# Patient Record
Sex: Male | Born: 1950
Health system: Southern US, Community
[De-identification: ages and names within clinical notes are randomized; demographics above are authoritative.]

## PROBLEM LIST (undated history)

## (undated) DIAGNOSIS — I10 Essential (primary) hypertension: Secondary | ICD-10-CM

---

## 2001-11-20 ENCOUNTER — Encounter: Admission: RE | Admit: 2001-11-20 | Discharge: 2001-11-20 | Payer: Self-pay | Admitting: Family Medicine

## 2001-11-20 ENCOUNTER — Encounter: Payer: Self-pay | Admitting: Family Medicine

## 2002-01-01 ENCOUNTER — Ambulatory Visit (HOSPITAL_BASED_OUTPATIENT_CLINIC_OR_DEPARTMENT_OTHER): Admission: RE | Admit: 2002-01-01 | Discharge: 2002-01-01 | Payer: Self-pay | Admitting: Plastic Surgery

## 2002-12-04 ENCOUNTER — Ambulatory Visit (HOSPITAL_COMMUNITY): Admission: RE | Admit: 2002-12-04 | Discharge: 2002-12-04 | Payer: Self-pay | Admitting: Gastroenterology

## 2006-03-05 ENCOUNTER — Encounter: Admission: RE | Admit: 2006-03-05 | Discharge: 2006-03-05 | Payer: Self-pay | Admitting: Family Medicine

## 2014-05-31 ENCOUNTER — Other Ambulatory Visit: Payer: Self-pay | Admitting: Family Medicine

## 2014-05-31 ENCOUNTER — Ambulatory Visit
Admission: RE | Admit: 2014-05-31 | Discharge: 2014-05-31 | Disposition: A | Discharge status: Home / Self Care | Payer: PRIVATE HEALTH INSURANCE | Source: Ambulatory Visit | Attending: Family Medicine | Admitting: Family Medicine

## 2014-05-31 DIAGNOSIS — R059 Cough, unspecified: Secondary | ICD-10-CM

## 2014-05-31 DIAGNOSIS — R05 Cough: Secondary | ICD-10-CM

## 2016-08-20 ENCOUNTER — Ambulatory Visit
Admission: RE | Admit: 2016-08-20 | Discharge: 2016-08-20 | Disposition: A | Discharge status: Home / Self Care | Payer: PRIVATE HEALTH INSURANCE | Source: Ambulatory Visit | Attending: Family Medicine | Admitting: Family Medicine

## 2016-08-20 ENCOUNTER — Other Ambulatory Visit: Payer: Self-pay | Admitting: Family Medicine

## 2016-08-20 DIAGNOSIS — R059 Cough, unspecified: Secondary | ICD-10-CM

## 2016-08-20 DIAGNOSIS — R05 Cough: Secondary | ICD-10-CM

## 2016-08-20 DIAGNOSIS — R079 Chest pain, unspecified: Secondary | ICD-10-CM

## 2017-03-05 ENCOUNTER — Other Ambulatory Visit: Payer: Self-pay | Admitting: Family Medicine

## 2017-03-05 DIAGNOSIS — F17200 Nicotine dependence, unspecified, uncomplicated: Secondary | ICD-10-CM | POA: Diagnosis not present

## 2017-03-05 DIAGNOSIS — Z1211 Encounter for screening for malignant neoplasm of colon: Secondary | ICD-10-CM | POA: Diagnosis not present

## 2017-03-05 DIAGNOSIS — Z1159 Encounter for screening for other viral diseases: Secondary | ICD-10-CM | POA: Diagnosis not present

## 2017-03-05 DIAGNOSIS — R0989 Other specified symptoms and signs involving the circulatory and respiratory systems: Secondary | ICD-10-CM | POA: Diagnosis not present

## 2017-03-05 DIAGNOSIS — I129 Hypertensive chronic kidney disease with stage 1 through stage 4 chronic kidney disease, or unspecified chronic kidney disease: Secondary | ICD-10-CM | POA: Diagnosis not present

## 2017-03-05 DIAGNOSIS — R972 Elevated prostate specific antigen [PSA]: Secondary | ICD-10-CM | POA: Diagnosis not present

## 2017-03-05 DIAGNOSIS — E782 Mixed hyperlipidemia: Secondary | ICD-10-CM | POA: Diagnosis not present

## 2017-03-05 DIAGNOSIS — N182 Chronic kidney disease, stage 2 (mild): Secondary | ICD-10-CM | POA: Diagnosis not present

## 2017-03-05 DIAGNOSIS — Z23 Encounter for immunization: Secondary | ICD-10-CM | POA: Diagnosis not present

## 2017-03-05 DIAGNOSIS — R809 Proteinuria, unspecified: Secondary | ICD-10-CM | POA: Diagnosis not present

## 2017-03-05 DIAGNOSIS — Z125 Encounter for screening for malignant neoplasm of prostate: Secondary | ICD-10-CM | POA: Diagnosis not present

## 2017-03-05 DIAGNOSIS — Z0000 Encounter for general adult medical examination without abnormal findings: Secondary | ICD-10-CM | POA: Diagnosis not present

## 2017-03-12 ENCOUNTER — Ambulatory Visit
Admission: RE | Admit: 2017-03-12 | Discharge: 2017-03-12 | Disposition: A | Discharge status: Home / Self Care | Payer: Medicare Other | Source: Ambulatory Visit | Attending: Family Medicine | Admitting: Family Medicine

## 2017-03-12 DIAGNOSIS — Z87891 Personal history of nicotine dependence: Secondary | ICD-10-CM | POA: Diagnosis not present

## 2017-03-12 DIAGNOSIS — Z136 Encounter for screening for cardiovascular disorders: Secondary | ICD-10-CM | POA: Diagnosis not present

## 2017-03-12 DIAGNOSIS — F17200 Nicotine dependence, unspecified, uncomplicated: Secondary | ICD-10-CM

## 2017-05-10 DIAGNOSIS — F17200 Nicotine dependence, unspecified, uncomplicated: Secondary | ICD-10-CM | POA: Diagnosis not present

## 2017-05-10 DIAGNOSIS — D125 Benign neoplasm of sigmoid colon: Secondary | ICD-10-CM | POA: Diagnosis not present

## 2017-05-10 DIAGNOSIS — K635 Polyp of colon: Secondary | ICD-10-CM | POA: Diagnosis not present

## 2017-05-10 DIAGNOSIS — Z1211 Encounter for screening for malignant neoplasm of colon: Secondary | ICD-10-CM | POA: Diagnosis not present

## 2017-05-10 DIAGNOSIS — K648 Other hemorrhoids: Secondary | ICD-10-CM | POA: Diagnosis not present

## 2017-05-10 DIAGNOSIS — K5730 Diverticulosis of large intestine without perforation or abscess without bleeding: Secondary | ICD-10-CM | POA: Diagnosis not present

## 2017-05-10 DIAGNOSIS — D123 Benign neoplasm of transverse colon: Secondary | ICD-10-CM | POA: Diagnosis not present

## 2017-05-10 DIAGNOSIS — Z86010 Personal history of colonic polyps: Secondary | ICD-10-CM | POA: Diagnosis not present

## 2017-05-10 DIAGNOSIS — Z7982 Long term (current) use of aspirin: Secondary | ICD-10-CM | POA: Diagnosis not present

## 2017-05-28 DIAGNOSIS — R197 Diarrhea, unspecified: Secondary | ICD-10-CM | POA: Diagnosis not present

## 2017-05-29 DIAGNOSIS — R197 Diarrhea, unspecified: Secondary | ICD-10-CM | POA: Diagnosis not present

## 2017-10-09 DIAGNOSIS — E871 Hypo-osmolality and hyponatremia: Secondary | ICD-10-CM | POA: Diagnosis not present

## 2017-10-09 DIAGNOSIS — I10 Essential (primary) hypertension: Secondary | ICD-10-CM | POA: Diagnosis not present

## 2017-10-09 DIAGNOSIS — R21 Rash and other nonspecific skin eruption: Secondary | ICD-10-CM | POA: Diagnosis not present

## 2017-10-14 ENCOUNTER — Emergency Department (HOSPITAL_BASED_OUTPATIENT_CLINIC_OR_DEPARTMENT_OTHER): Payer: Medicare Other

## 2017-10-14 ENCOUNTER — Other Ambulatory Visit: Payer: Self-pay

## 2017-10-14 ENCOUNTER — Encounter (HOSPITAL_BASED_OUTPATIENT_CLINIC_OR_DEPARTMENT_OTHER): Payer: Self-pay | Admitting: Emergency Medicine

## 2017-10-14 ENCOUNTER — Emergency Department (HOSPITAL_BASED_OUTPATIENT_CLINIC_OR_DEPARTMENT_OTHER)
Admission: EM | Admit: 2017-10-14 | Discharge: 2017-10-14 | Disposition: A | Discharge status: Home / Self Care | Payer: Medicare Other | Attending: Emergency Medicine | Admitting: Emergency Medicine

## 2017-10-14 DIAGNOSIS — L509 Urticaria, unspecified: Secondary | ICD-10-CM | POA: Insufficient documentation

## 2017-10-14 DIAGNOSIS — I10 Essential (primary) hypertension: Secondary | ICD-10-CM | POA: Insufficient documentation

## 2017-10-14 DIAGNOSIS — F17210 Nicotine dependence, cigarettes, uncomplicated: Secondary | ICD-10-CM | POA: Insufficient documentation

## 2017-10-14 DIAGNOSIS — T7840XA Allergy, unspecified, initial encounter: Secondary | ICD-10-CM | POA: Insufficient documentation

## 2017-10-14 DIAGNOSIS — R220 Localized swelling, mass and lump, head: Secondary | ICD-10-CM

## 2017-10-14 DIAGNOSIS — R21 Rash and other nonspecific skin eruption: Secondary | ICD-10-CM | POA: Diagnosis not present

## 2017-10-14 DIAGNOSIS — R600 Localized edema: Secondary | ICD-10-CM | POA: Diagnosis present

## 2017-10-14 HISTORY — DX: Essential (primary) hypertension: I10

## 2017-10-14 LAB — CBC WITH DIFFERENTIAL/PLATELET
BASOS ABS: 0 10*3/uL (ref 0.0–0.1)
BASOS PCT: 1 %
EOS ABS: 0.3 10*3/uL (ref 0.0–0.7)
Eosinophils Relative: 4 %
HEMATOCRIT: 36.9 % — AB (ref 39.0–52.0)
HEMOGLOBIN: 12.9 g/dL — AB (ref 13.0–17.0)
Lymphocytes Relative: 17 %
Lymphs Abs: 1.4 10*3/uL (ref 0.7–4.0)
MCH: 32.7 pg (ref 26.0–34.0)
MCHC: 35 g/dL (ref 30.0–36.0)
MCV: 93.7 fL (ref 78.0–100.0)
Monocytes Absolute: 0.8 10*3/uL (ref 0.1–1.0)
Monocytes Relative: 10 %
NEUTROS ABS: 5.8 10*3/uL (ref 1.7–7.7)
NEUTROS PCT: 68 %
Platelets: 266 10*3/uL (ref 150–400)
RBC: 3.94 MIL/uL — ABNORMAL LOW (ref 4.22–5.81)
RDW: 12.1 % (ref 11.5–15.5)
WBC: 8.4 10*3/uL (ref 4.0–10.5)

## 2017-10-14 LAB — BASIC METABOLIC PANEL
ANION GAP: 9 (ref 5–15)
BUN: 9 mg/dL (ref 6–20)
CALCIUM: 8.9 mg/dL (ref 8.9–10.3)
CO2: 28 mmol/L (ref 22–32)
CREATININE: 0.78 mg/dL (ref 0.61–1.24)
Chloride: 96 mmol/L — ABNORMAL LOW (ref 101–111)
GFR calc non Af Amer: >60 mL/min (ref >60)
Glucose, Bld: 117 mg/dL — ABNORMAL HIGH (ref 65–99)
Potassium: 4.7 mmol/L (ref 3.5–5.1)
SODIUM: 133 mmol/L — AB (ref 135–145)

## 2017-10-14 MED ORDER — FAMOTIDINE IN NACL 20-0.9 MG/50ML-% IV SOLN
20.0000 mg | Freq: Once | INTRAVENOUS | Status: AC
  Administered 2017-10-14: 20 mg via INTRAVENOUS
  Filled 2017-10-14: qty 50

## 2017-10-14 MED ORDER — METHYLPREDNISOLONE SODIUM SUCC 125 MG IJ SOLR
125.0000 mg | Freq: Once | INTRAMUSCULAR | Status: AC
  Administered 2017-10-14: 125 mg via INTRAVENOUS
  Filled 2017-10-14: qty 2

## 2017-10-14 MED ORDER — SODIUM CHLORIDE 0.9 % IV BOLUS (SEPSIS)
500.0000 mL | Freq: Once | INTRAVENOUS | Status: AC
  Administered 2017-10-14: 500 mL via INTRAVENOUS

## 2017-10-14 MED ORDER — FAMOTIDINE 40 MG PO TABS: 40.0000 mg | ORAL_TABLET | Freq: Every day | ORAL | 0 refills | Status: DC

## 2017-10-14 MED ORDER — EPINEPHRINE 0.3 MG/0.3ML IJ SOAJ: 0.3000 mg | Freq: Once | INTRAMUSCULAR | 0 refills | Status: AC

## 2017-10-14 MED ORDER — DIPHENHYDRAMINE HCL 50 MG/ML IJ SOLN
25.0000 mg | Freq: Once | INTRAMUSCULAR | Status: AC
  Administered 2017-10-14: 25 mg via INTRAVENOUS
  Filled 2017-10-14: qty 1

## 2017-10-14 MED ORDER — PREDNISONE 20 MG PO TABS: 40.0000 mg | ORAL_TABLET | Freq: Every day | ORAL | 0 refills | Status: DC

## 2017-10-14 MED FILL — EPINEPHRINE 0.3 MG AUTO-INJ: 0.3 | 30 days supply | Qty: 2 | Fill #0

## 2017-10-14 MED FILL — FAMOTIDINE 40 MG TABLET: 40 | 30 days supply | Qty: 30 | Fill #0

## 2017-10-14 MED FILL — predniSONE 20 MG TABS: 20 | 2 days supply | Qty: 4 | Fill #0

## 2017-10-14 NOTE — ED Triage Notes (Signed)
Reports diagnosed with viral infection last week.  States over the past week he has begun to have a rash to his back side.  Reports that he woke up this morning with lip swelling.  Treated with 1 benadryl at home prior to arrival.

## 2017-10-14 NOTE — ED Notes (Signed)
ED Provider at bedside. 

## 2017-10-14 NOTE — ED Notes (Signed)
Pt verbalized understanding of discharge instructions and denies any further questions at this time.   

## 2017-10-14 NOTE — Discharge Instructions (Signed)

## 2017-10-14 NOTE — ED Provider Notes (Signed)
Emergency Department Provider Note   I have reviewed the triage vital signs and the nursing notes.   HISTORY  Chief Complaint Facial Swelling   HPI Sean Austin is a 67 y.o. male with PMH of HTN and HLD presents to the ED for evaluation of swelling to the upper lip, voice change, and migratory/itchy rash.  The patient developed a rash yesterday which was slightly itchy.  He notes a recent upper respiratory tract infection some diarrhea which have resolved.  He is been compliant with his home medications.  He does not take any ACE/ARB meds.  No history of lip/tongue swelling in the past.  This morning he woke up and had mild swelling of the upper lip and noticed that it worsened throughout the morning.  He also developed a slight change to the voice and so presented to the emergency department.  He denies any sensation of throat tightness or swelling.  No tongue swelling.  No difficulty swallowing or breathing.  He did take 25 mg of Benadryl prior to ED presentation.    Past Medical History:  Diagnosis Date  . Hypertension     There are no active problems to display for this patient.   History reviewed. No pertinent surgical history.  Current Outpatient Rx  . Order #: [id number] Class: Historical Med  . Order #: [id number] Class: Historical Med  . Order #: [id number] Class: Historical Med  . Order #: [id number] Class: Historical Med  . Order #: [id number] Class: Print  . Order #: [id number] Class: Print  . Order #: [id number] Class: Print    Allergies Patient has no known allergies.  History reviewed. No pertinent family history.  Social History Social History   Tobacco Use  . Smoking status: Current Every Day Smoker    Packs/day: 1.00    Types: Cigarettes  . Smokeless tobacco: Never Used  Substance Use Topics  . Alcohol use: Yes  . Drug use: No    Review of Systems  Constitutional: No fever/chills Eyes: No visual changes. ENT: No sore throat. Voice horseness. Positive  upper lip swelling.  Cardiovascular: Denies chest pain. Respiratory: Denies shortness of breath. Gastrointestinal: No abdominal pain.  No nausea, no vomiting.  No diarrhea.  No constipation. Genitourinary: Negative for dysuria. Musculoskeletal: Negative for back pain. Skin: Positive itchy rash.  Neurological: Negative for headaches, focal weakness or numbness.  10-point ROS otherwise negative.  ____________________________________________   PHYSICAL EXAM:  VITAL SIGNS: ED Triage Vitals  Enc Vitals Group     BP 10/14/17 1101 (!) 185/95     Pulse Rate 10/14/17 1101 95     Resp 10/14/17 1101 16     Temp 10/14/17 1101 97.7 F (36.5 C)     Temp Source 10/14/17 1101 Oral     SpO2 10/14/17 1101 100 %     Weight 10/14/17 1101 198 lb (89.8 kg)     Height 10/14/17 1101 6\' 1"  (1.854 m)     Pain Score 10/14/17 1059 2   Constitutional: Alert and oriented. Well appearing and in no acute distress. Eyes: Conjunctivae are normal.  Head: Atraumatic. Nose: No congestion/rhinnorhea. Mouth/Throat: Mucous membranes are moist.  Oropharynx is widely patent. Normal tongue exam. No submandibular swelling. Managing oral secretions. Voice is slightly hoarse.  Neck: No stridor.    Cardiovascular: Normal rate, regular rhythm. Good peripheral circulation. Grossly normal heart sounds.   Respiratory: Normal respiratory effort.  No retractions. Lungs CTAB. Gastrointestinal: Soft and nontender. No distention.  Musculoskeletal: No lower extremity tenderness nor edema. No gross deformities  of extremities. Neurologic:  Normal speech and language. No gross focal neurologic deficits are appreciated.  Skin:  Skin is warm, dry and intact. No rash noted.  ____________________________________________   LABS (all labs ordered are listed, but only abnormal results are displayed)  Labs Reviewed  BASIC METABOLIC PANEL - Abnormal; Notable for the following components:      Result Value   Sodium 133 (*)     Chloride 96 (*)    Glucose, Bld 117 (*)    All other components within normal limits  CBC WITH DIFFERENTIAL/PLATELET - Abnormal; Notable for the following components:   RBC 3.94 (*)    Hemoglobin 12.9 (*)    HCT 36.9 (*)    All other components within normal limits   ____________________________________________  RADIOLOGY  Dg Neck Soft Tissue  Result Date: 10/14/2017 CLINICAL DATA:  Diagnosed with viral infection last week. Rash this week. This morning with lip swelling and loss of voice. EXAM: NECK SOFT TISSUES - 1+ VIEW COMPARISON:  None. FINDINGS: There is no evidence of retropharyngeal soft tissue swelling or epiglottic enlargement. The cervical airway is unremarkable and no radio-opaque foreign body identified. Incidental note made of mild degenerative change in the mid and lower cervical spine. IMPRESSION: No acute findings. Electronically Signed   By: Franki Cabot M.D.   On: 10/14/2017 11:33    ____________________________________________   PROCEDURES  Procedure(s) performed:   Procedures  None ____________________________________________   INITIAL IMPRESSION / ASSESSMENT AND PLAN / ED COURSE  Pertinent labs & imaging results that were available during my care of the patient were reviewed by me and considered in my medical decision making (see chart for details).  Presents to the emergency department for evaluation of itchy rash, upper lip swelling, hoarse voice.  Symptoms began mildly yesterday and worsened through the morning today.  No respiratory distress.  He is managing oral secretions without difficulty.  He has isolated upper lip swelling that seems consistent with angioedema however he also has a hive-like rash which raises my suspicion for acute allergic reaction.  No EpiPen required at this time.  Will start IV fluids, obtain labs, give Benadryl, Pepcid, steroid, and watch closely for progressing symptoms.   12:45 PM Patient is feeling better after  medications.  His lip swelling is noticeably decreased.  He has never had difficulty breathing or swallowing.  Plain film of the neck was reviewed.  He has no acute findings.  Plan for additional observation and reassessment. Labs reviewed with no acute findings.   Patient continues to look well. Discharged home with Rx for EpiPen, Prednisone burst, and Pepcid. Patient to take Benadryl PRN. Discussed use of EpiPen in detail. Patient to discuss Allergist f/u with PCP.   At this time, I do not feel there is any life-threatening condition present. I have reviewed and discussed all results (EKG, imaging, lab, urine as appropriate), exam findings with patient. I have reviewed nursing notes and appropriate previous records.  I feel the patient is safe to be discharged home without further emergent workup. Discussed usual and customary return precautions. Patient and family (if present) verbalize understanding and are comfortable with this plan.  Patient will follow-up with their primary care provider. If they do not have a primary care provider, information for follow-up has been provided to them. All questions have been answered.  ____________________________________________  FINAL CLINICAL IMPRESSION(S) / ED DIAGNOSES  Final diagnoses:  Allergic reaction, initial encounter  Swelling of upper lip  Hives  MEDICATIONS GIVEN DURING THIS VISIT:  Medications  sodium chloride 0.9 % bolus 500 mL (0 mLs Intravenous Stopped 10/14/17 1255)  diphenhydrAMINE (BENADRYL) injection 25 mg (25 mg Intravenous Given 10/14/17 1147)  methylPREDNISolone sodium succinate (SOLU-MEDROL) 125 mg/2 mL injection 125 mg (125 mg Intravenous Given 10/14/17 1147)  famotidine (PEPCID) IVPB 20 mg premix (0 mg Intravenous Stopped 10/14/17 1217)     NEW OUTPATIENT MEDICATIONS STARTED DURING THIS VISIT:  Discharge Medication List as of 10/14/2017  3:10 PM    START taking these medications   Details  EPINEPHrine (EPIPEN 2-PAK) 0.3  mg/0.3 mL IJ SOAJ injection Inject 0.3 mLs (0.3 mg total) into the muscle once for 1 dose., Starting Mon 10/14/2017, Print    famotidine (PEPCID) 40 MG tablet Take 1 tablet (40 mg total) by mouth daily., Starting Mon 10/14/2017, Print    predniSONE (DELTASONE) 20 MG tablet Take 2 tablets (40 mg total) by mouth daily., Starting Mon 10/14/2017, Print        Note:  This document was prepared using Dragon voice recognition software and may include unintentional dictation errors.  Nanda Quinton, MD Emergency Medicine    Bridgitt Raggio, [facility], MD 10/14/17 [phone]

## 2017-10-18 DIAGNOSIS — L509 Urticaria, unspecified: Secondary | ICD-10-CM | POA: Diagnosis not present

## 2017-10-18 DIAGNOSIS — R220 Localized swelling, mass and lump, head: Secondary | ICD-10-CM | POA: Diagnosis not present

## 2018-01-26 ENCOUNTER — Other Ambulatory Visit: Payer: Self-pay

## 2018-01-26 ENCOUNTER — Encounter (HOSPITAL_BASED_OUTPATIENT_CLINIC_OR_DEPARTMENT_OTHER): Payer: Self-pay | Admitting: Emergency Medicine

## 2018-01-26 ENCOUNTER — Emergency Department (HOSPITAL_BASED_OUTPATIENT_CLINIC_OR_DEPARTMENT_OTHER)
Admission: EM | Admit: 2018-01-26 | Discharge: 2018-01-26 | Disposition: A | Discharge status: Home / Self Care | Payer: Medicare Other | Attending: Emergency Medicine | Admitting: Emergency Medicine

## 2018-01-26 DIAGNOSIS — F17210 Nicotine dependence, cigarettes, uncomplicated: Secondary | ICD-10-CM | POA: Diagnosis not present

## 2018-01-26 DIAGNOSIS — Z79899 Other long term (current) drug therapy: Secondary | ICD-10-CM | POA: Diagnosis not present

## 2018-01-26 DIAGNOSIS — R21 Rash and other nonspecific skin eruption: Secondary | ICD-10-CM | POA: Diagnosis not present

## 2018-01-26 DIAGNOSIS — I10 Essential (primary) hypertension: Secondary | ICD-10-CM | POA: Diagnosis not present

## 2018-01-26 MED ORDER — PREDNISONE 50 MG PO TABS: 50.0000 mg | ORAL_TABLET | Freq: Every day | ORAL | 0 refills | Status: DC

## 2018-01-26 MED ORDER — PREDNISONE 50 MG PO TABS
60.0000 mg | ORAL_TABLET | Freq: Once | ORAL | Status: AC
  Administered 2018-01-26: 60 mg via ORAL
  Filled 2018-01-26: qty 1

## 2018-01-26 MED ORDER — FAMOTIDINE 40 MG PO TABS: 40.0000 mg | ORAL_TABLET | Freq: Every day | ORAL | 0 refills | Status: DC

## 2018-01-26 NOTE — ED Triage Notes (Signed)
Pt reports red splotchy rash x 3 days. Possibly allergic to something. Denies itching.

## 2018-01-26 NOTE — Discharge Instructions (Signed)
You were seen in the emergency department today for a rash.  Given that you had a similar rash with previous allergic reaction we are giving you 2 medications today: - Prednisone- this is a steroid that you will take daily for the next 4 days, we gave you a dose of this in the ER today so start tomorrow - Pepcid- take this daily as well as this will help with the reaction.   Continue to take benadryl per over the counter dosing  The medicines that we are giving you today are the same medicines you were prescribed during her last ER visit for a rash that was similar.  As discussed if you start to experience facial swelling, trouble breathing, feeling of throat closing, or any other concerning symptoms return to the emergency department immediately.  As discussed if you feel your epi pen is necessary and you utilize it return to the ER immediately.   We are unsure of exactly what is causing these rashes.  We would like you to follow-up with an allergy/immunologist specialist, we have given you information for when your discharge instructions.  If unable to get into see them in the near future please follow-up with your primary care provider within 3 days and subsequently the specialist when able.

## 2018-01-26 NOTE — ED Provider Notes (Signed)
[facility] EMERGENCY DEPARTMENT Provider Note   CSN: [id number] Arrival date & time: 01/26/18  1312     History   Chief Complaint Chief Complaint  Patient presents with  . Rash    HPI Sean Austin is a 67 y.o. male with a hx of tobacco abuse, HTN, and hyperlipidemia who presents to the ED with complaints of rash that started 3 days ago. Patient states he has a red rash to the upper and lower extremities as well as the chest, abdomen, and back. He states it is not necessarily itchy. Has tried benadryl without much change. No specific alleviating/aggravating factors. He does not believe that he had any recent new exposures such as new foods, products, topical agents, or environments. Denies new medications. He states that he had a very similar rash in March of this year- he states he saw his PCP who thought this was viral, the rash subsequently progressively worsened and he developed dyspnea and facial swelling days later. He states that he has absolutely not had any type of facial swelling/rash, dyspnea, or sensation of throat closing presently, but he states that this rash is similar. He does have an epi pen at home that he has not utilized. Denies fever, nausea, vomiting, diarrhea, congestion, cough, dyspnea, wheezing, or throat closing sensation. NO recent tic exposures.   HPI  Past Medical History:  Diagnosis Date  . Hypertension     There are no active problems to display for this patient.   History reviewed. No pertinent surgical history.      Home Medications    Prior to Admission medications   Medication Sig Start Date End Date Taking? Authorizing Provider  AMLODIPINE BESYLATE PO Take 10 mg by mouth.    [provider]  atenolol (TENORMIN) 50 MG tablet Take 50 mg by mouth daily.    [provider]  famotidine (PEPCID) 40 MG tablet Take 1 tablet (40 mg total) by mouth daily. 10/14/17   Long, [facility], MD  losartan (COZAAR) 50 MG tablet Take  50 mg by mouth daily.    [provider]  predniSONE (DELTASONE) 20 MG tablet Take 2 tablets (40 mg total) by mouth daily. 10/14/17   Long, [facility], MD  simvastatin (ZOCOR) 40 MG tablet Take 40 mg by mouth daily.    [provider]    Family History No family history on file.  Social History Social History   Tobacco Use  . Smoking status: Current Every Day Smoker    Packs/day: 1.00    Types: Cigarettes  . Smokeless tobacco: Never Used  Substance Use Topics  . Alcohol use: Yes  . Drug use: No     Allergies   Patient has no known allergies.   Review of Systems Review of Systems  Constitutional: Negative for chills and fever.  HENT: Negative for congestion and facial swelling.   Respiratory: Negative for cough, shortness of breath and wheezing.   Cardiovascular: Negative for chest pain and palpitations.  Gastrointestinal: Negative for abdominal pain and vomiting.  Skin: Positive for rash.   Physical Exam Updated Vital Signs BP (!) 164/94 (BP Location: Left Arm)   Pulse (!) 111   Temp 99.5 F (37.5 C) (Oral)   Resp 18   Ht 6\' 1"  (1.854 m)   Wt 88.9 kg (196 lb)   SpO2 98%   BMI 25.86 kg/m   Physical Exam  Constitutional: He appears well-developed and well-nourished.  Non-toxic appearance. No distress.  HENT:  Head: Normocephalic and atraumatic.  Right Ear: Tympanic membrane normal.  Left Ear: Tympanic membrane normal.  Nose: Nose normal.  Mouth/Throat: Uvula is midline and oropharynx is clear and moist. No oral lesions. No oropharyngeal exudate or posterior oropharyngeal erythema.  No posterior oropharyngeal edema.  Patient airway is patent.  No trismus.  No drooling.  Tolerating his own secretions without difficulty.  No stridor.  No appreciable facial swelling or angioedema.  Eyes: Pupils are equal, round, and reactive to light. Conjunctivae are normal. Right eye exhibits no discharge. Left eye exhibits no discharge.  Cardiovascular: Normal rate  and regular rhythm.  No murmur heard. Pulmonary/Chest: Effort normal and breath sounds normal. No accessory muscle usage or stridor. No respiratory distress. He has no wheezes. He has no rhonchi. He has no rales.  Abdominal: Soft. There is no tenderness. There is no rigidity, no rebound and no guarding.  Neurological: He is alert.  Clear speech.   Skin: Rash (erythematous somewhat speckled appearing rash to trunk and extremities x 4, blanches, not particularly raised. no mucous membrane involvement. no palm/sole involvement.  no sloughing) noted. No petechiae and no purpura noted. Rash is not nodular, not pustular and not vesicular.  Psychiatric: He has a normal mood and affect. His behavior is normal. Thought content normal.  Nursing note and vitals reviewed.    ED Treatments / Results  Labs (all labs ordered are listed, but only abnormal results are displayed) Labs Reviewed - No data to display  EKG None  Radiology No results found.  Procedures Procedures (including critical care time)  Medications Ordered in ED Medications  predniSONE (DELTASONE) tablet 60 mg (has no administration in time range)     Initial Impression / Assessment and Plan / ED Course  I have reviewed the triage vital signs and the nursing notes.  Pertinent labs & imaging results that were available during my care of the patient were reviewed by me and considered in my medical decision making (see chart for details).   Patient presents with erythematous blanching rash to trunk and extremities. Patient non toxic appearing, in no apparent distress, vitals WNL other than elevated BP and tachycardia which improved throughout visit, doubt HTN emergency, patient aware of need for recheck.  Patient denies any difficulty breathing or swallowing.  Patient has a patent airway without stridor and is handling secretions without difficulty; no angioedema. No blisters, no pustules, no draining sinus tracts, no superficial  abscesses, no bullous impetigo, no vesicles, no desquamation, no target lesions with dusky purpura or a central bulla. Not tender to touch. No concern for superimposed infection. No concern for SJS, TEN, TSS, tick borne illness, syphilis or other life-threatening condition at this time. Rash somewhat suspicion for an allergic dermatitis, given hx of similar. Patient had similar with progression to facial swelling which he received epinephrine for, not having those signs/sxs at this time. Given this information will place patient on steroid burst with prescription for pepcid and continued OTC benadryl with very strict return precautions, he does have an epi pen at home if necessary, discussed reasons to utilize and that if he utilizes it he needs to come to the ER right away. I discussed treatment plan, need for allergy/immunology and PCP follow-up, and return precautions with the patient and his wife. Provided opportunity for questions, patient and his wife confirmed understanding and are in agreement with plan.   Vitals:   01/26/18 1316 01/26/18 1453  BP: (!) 164/94 (!) 157/94  Pulse: (!) 111 98  Resp: 18 16  Temp: 99.5 F (37.5 C)   SpO2: 98% 97%   Final Clinical Impressions(s) / ED Diagnoses   Final diagnoses:  Rash    ED Discharge Orders        Ordered    predniSONE (DELTASONE) 50 MG tablet  Daily with breakfast     01/26/18 1457    famotidine (PEPCID) 40 MG tablet  Daily     01/26/18 1457       Latavius Capizzi, Glynda Jaeger, PA-C 01/26/18 1513    Tanna Furry, MD 02/01/18 1846

## 2018-03-21 ENCOUNTER — Other Ambulatory Visit: Payer: Self-pay | Admitting: Family Medicine

## 2018-03-21 DIAGNOSIS — I714 Abdominal aortic aneurysm, without rupture, unspecified: Secondary | ICD-10-CM

## 2018-03-28 ENCOUNTER — Ambulatory Visit
Admission: RE | Admit: 2018-03-28 | Discharge: 2018-03-28 | Disposition: A | Discharge status: Home / Self Care | Payer: Medicare Other | Source: Ambulatory Visit | Attending: Family Medicine | Admitting: Family Medicine

## 2018-03-28 DIAGNOSIS — I714 Abdominal aortic aneurysm, without rupture, unspecified: Secondary | ICD-10-CM

## 2018-04-02 DIAGNOSIS — R609 Edema, unspecified: Secondary | ICD-10-CM | POA: Diagnosis not present

## 2018-04-02 DIAGNOSIS — F17200 Nicotine dependence, unspecified, uncomplicated: Secondary | ICD-10-CM | POA: Diagnosis not present

## 2018-04-02 DIAGNOSIS — E782 Mixed hyperlipidemia: Secondary | ICD-10-CM | POA: Diagnosis not present

## 2018-04-02 DIAGNOSIS — K635 Polyp of colon: Secondary | ICD-10-CM | POA: Diagnosis not present

## 2018-04-02 DIAGNOSIS — I714 Abdominal aortic aneurysm, without rupture: Secondary | ICD-10-CM | POA: Diagnosis not present

## 2018-04-02 DIAGNOSIS — N182 Chronic kidney disease, stage 2 (mild): Secondary | ICD-10-CM | POA: Diagnosis not present

## 2018-04-02 DIAGNOSIS — I8310 Varicose veins of unspecified lower extremity with inflammation: Secondary | ICD-10-CM | POA: Diagnosis not present

## 2018-04-02 DIAGNOSIS — I129 Hypertensive chronic kidney disease with stage 1 through stage 4 chronic kidney disease, or unspecified chronic kidney disease: Secondary | ICD-10-CM | POA: Diagnosis not present

## 2018-04-02 DIAGNOSIS — R972 Elevated prostate specific antigen [PSA]: Secondary | ICD-10-CM | POA: Diagnosis not present

## 2018-04-02 DIAGNOSIS — Z125 Encounter for screening for malignant neoplasm of prostate: Secondary | ICD-10-CM | POA: Diagnosis not present

## 2018-04-02 DIAGNOSIS — Z1211 Encounter for screening for malignant neoplasm of colon: Secondary | ICD-10-CM | POA: Diagnosis not present

## 2018-04-02 DIAGNOSIS — Z0000 Encounter for general adult medical examination without abnormal findings: Secondary | ICD-10-CM | POA: Diagnosis not present

## 2018-04-03 DIAGNOSIS — Z1211 Encounter for screening for malignant neoplasm of colon: Secondary | ICD-10-CM | POA: Diagnosis not present

## 2018-04-04 DIAGNOSIS — I129 Hypertensive chronic kidney disease with stage 1 through stage 4 chronic kidney disease, or unspecified chronic kidney disease: Secondary | ICD-10-CM | POA: Diagnosis not present

## 2018-04-04 DIAGNOSIS — R972 Elevated prostate specific antigen [PSA]: Secondary | ICD-10-CM | POA: Diagnosis not present

## 2018-04-04 DIAGNOSIS — E782 Mixed hyperlipidemia: Secondary | ICD-10-CM | POA: Diagnosis not present

## 2018-04-04 DIAGNOSIS — Z125 Encounter for screening for malignant neoplasm of prostate: Secondary | ICD-10-CM | POA: Diagnosis not present

## 2018-05-14 DIAGNOSIS — R350 Frequency of micturition: Secondary | ICD-10-CM | POA: Diagnosis not present

## 2018-05-14 DIAGNOSIS — N401 Enlarged prostate with lower urinary tract symptoms: Secondary | ICD-10-CM | POA: Diagnosis not present

## 2018-05-14 DIAGNOSIS — R972 Elevated prostate specific antigen [PSA]: Secondary | ICD-10-CM | POA: Diagnosis not present

## 2018-09-02 DIAGNOSIS — R972 Elevated prostate specific antigen [PSA]: Secondary | ICD-10-CM | POA: Diagnosis not present

## 2018-10-09 DIAGNOSIS — N182 Chronic kidney disease, stage 2 (mild): Secondary | ICD-10-CM | POA: Diagnosis not present

## 2018-10-09 DIAGNOSIS — R972 Elevated prostate specific antigen [PSA]: Secondary | ICD-10-CM | POA: Diagnosis not present

## 2018-10-09 DIAGNOSIS — F17200 Nicotine dependence, unspecified, uncomplicated: Secondary | ICD-10-CM | POA: Diagnosis not present

## 2018-10-09 DIAGNOSIS — I129 Hypertensive chronic kidney disease with stage 1 through stage 4 chronic kidney disease, or unspecified chronic kidney disease: Secondary | ICD-10-CM | POA: Diagnosis not present

## 2018-10-09 DIAGNOSIS — E782 Mixed hyperlipidemia: Secondary | ICD-10-CM | POA: Diagnosis not present

## 2018-11-19 DIAGNOSIS — R972 Elevated prostate specific antigen [PSA]: Secondary | ICD-10-CM | POA: Diagnosis not present

## 2018-11-26 DIAGNOSIS — R351 Nocturia: Secondary | ICD-10-CM | POA: Diagnosis not present

## 2018-11-26 DIAGNOSIS — R972 Elevated prostate specific antigen [PSA]: Secondary | ICD-10-CM | POA: Diagnosis not present

## 2019-04-17 DIAGNOSIS — I129 Hypertensive chronic kidney disease with stage 1 through stage 4 chronic kidney disease, or unspecified chronic kidney disease: Secondary | ICD-10-CM | POA: Diagnosis not present

## 2019-04-17 DIAGNOSIS — E782 Mixed hyperlipidemia: Secondary | ICD-10-CM | POA: Diagnosis not present

## 2019-04-17 DIAGNOSIS — Z125 Encounter for screening for malignant neoplasm of prostate: Secondary | ICD-10-CM | POA: Diagnosis not present

## 2019-04-22 DIAGNOSIS — Z1211 Encounter for screening for malignant neoplasm of colon: Secondary | ICD-10-CM | POA: Diagnosis not present

## 2019-04-22 DIAGNOSIS — I129 Hypertensive chronic kidney disease with stage 1 through stage 4 chronic kidney disease, or unspecified chronic kidney disease: Secondary | ICD-10-CM | POA: Diagnosis not present

## 2019-04-22 DIAGNOSIS — Z0000 Encounter for general adult medical examination without abnormal findings: Secondary | ICD-10-CM | POA: Diagnosis not present

## 2019-04-22 DIAGNOSIS — E782 Mixed hyperlipidemia: Secondary | ICD-10-CM | POA: Diagnosis not present

## 2019-04-22 DIAGNOSIS — Z125 Encounter for screening for malignant neoplasm of prostate: Secondary | ICD-10-CM | POA: Diagnosis not present

## 2019-04-22 DIAGNOSIS — M5430 Sciatica, unspecified side: Secondary | ICD-10-CM | POA: Diagnosis not present

## 2019-04-22 DIAGNOSIS — R972 Elevated prostate specific antigen [PSA]: Secondary | ICD-10-CM | POA: Diagnosis not present

## 2019-04-22 DIAGNOSIS — F17200 Nicotine dependence, unspecified, uncomplicated: Secondary | ICD-10-CM | POA: Diagnosis not present

## 2019-04-22 DIAGNOSIS — I8310 Varicose veins of unspecified lower extremity with inflammation: Secondary | ICD-10-CM | POA: Diagnosis not present

## 2019-04-22 DIAGNOSIS — K635 Polyp of colon: Secondary | ICD-10-CM | POA: Diagnosis not present

## 2019-04-22 DIAGNOSIS — N182 Chronic kidney disease, stage 2 (mild): Secondary | ICD-10-CM | POA: Diagnosis not present

## 2019-04-22 DIAGNOSIS — I714 Abdominal aortic aneurysm, without rupture: Secondary | ICD-10-CM | POA: Diagnosis not present

## 2019-05-06 DIAGNOSIS — K635 Polyp of colon: Secondary | ICD-10-CM | POA: Diagnosis not present

## 2019-05-13 DIAGNOSIS — R351 Nocturia: Secondary | ICD-10-CM | POA: Diagnosis not present

## 2019-05-13 DIAGNOSIS — N401 Enlarged prostate with lower urinary tract symptoms: Secondary | ICD-10-CM | POA: Diagnosis not present

## 2019-05-13 DIAGNOSIS — R972 Elevated prostate specific antigen [PSA]: Secondary | ICD-10-CM | POA: Diagnosis not present

## 2019-05-15 DIAGNOSIS — Z7189 Other specified counseling: Secondary | ICD-10-CM | POA: Diagnosis not present

## 2019-05-15 DIAGNOSIS — R21 Rash and other nonspecific skin eruption: Secondary | ICD-10-CM | POA: Diagnosis not present

## 2019-05-15 DIAGNOSIS — R1110 Vomiting, unspecified: Secondary | ICD-10-CM | POA: Diagnosis not present

## 2019-05-15 DIAGNOSIS — R5383 Other fatigue: Secondary | ICD-10-CM | POA: Diagnosis not present

## 2019-08-10 DIAGNOSIS — R972 Elevated prostate specific antigen [PSA]: Secondary | ICD-10-CM | POA: Diagnosis not present

## 2019-09-22 ENCOUNTER — Ambulatory Visit: Payer: Medicare Other | Attending: Internal Medicine

## 2019-09-24 ENCOUNTER — Ambulatory Visit: Payer: Medicare Other

## 2019-10-26 DIAGNOSIS — N182 Chronic kidney disease, stage 2 (mild): Secondary | ICD-10-CM | POA: Diagnosis not present

## 2019-10-26 DIAGNOSIS — E782 Mixed hyperlipidemia: Secondary | ICD-10-CM | POA: Diagnosis not present

## 2019-10-26 DIAGNOSIS — F17200 Nicotine dependence, unspecified, uncomplicated: Secondary | ICD-10-CM | POA: Diagnosis not present

## 2019-10-26 DIAGNOSIS — I129 Hypertensive chronic kidney disease with stage 1 through stage 4 chronic kidney disease, or unspecified chronic kidney disease: Secondary | ICD-10-CM | POA: Diagnosis not present

## 2019-11-10 DIAGNOSIS — N400 Enlarged prostate without lower urinary tract symptoms: Secondary | ICD-10-CM | POA: Diagnosis not present

## 2019-11-10 DIAGNOSIS — R972 Elevated prostate specific antigen [PSA]: Secondary | ICD-10-CM | POA: Diagnosis not present

## 2020-01-14 IMAGING — CR DG NECK SOFT TISSUE
2 series · 2 of 2 positions shown · non-contrast
Comparison: None.

CLINICAL DATA: Diagnosed with viral infection last week. Rash this
week. This morning with lip swelling and loss of voice.

EXAM:
NECK SOFT TISSUES - 1+ VIEW

[w soft tissue neck]
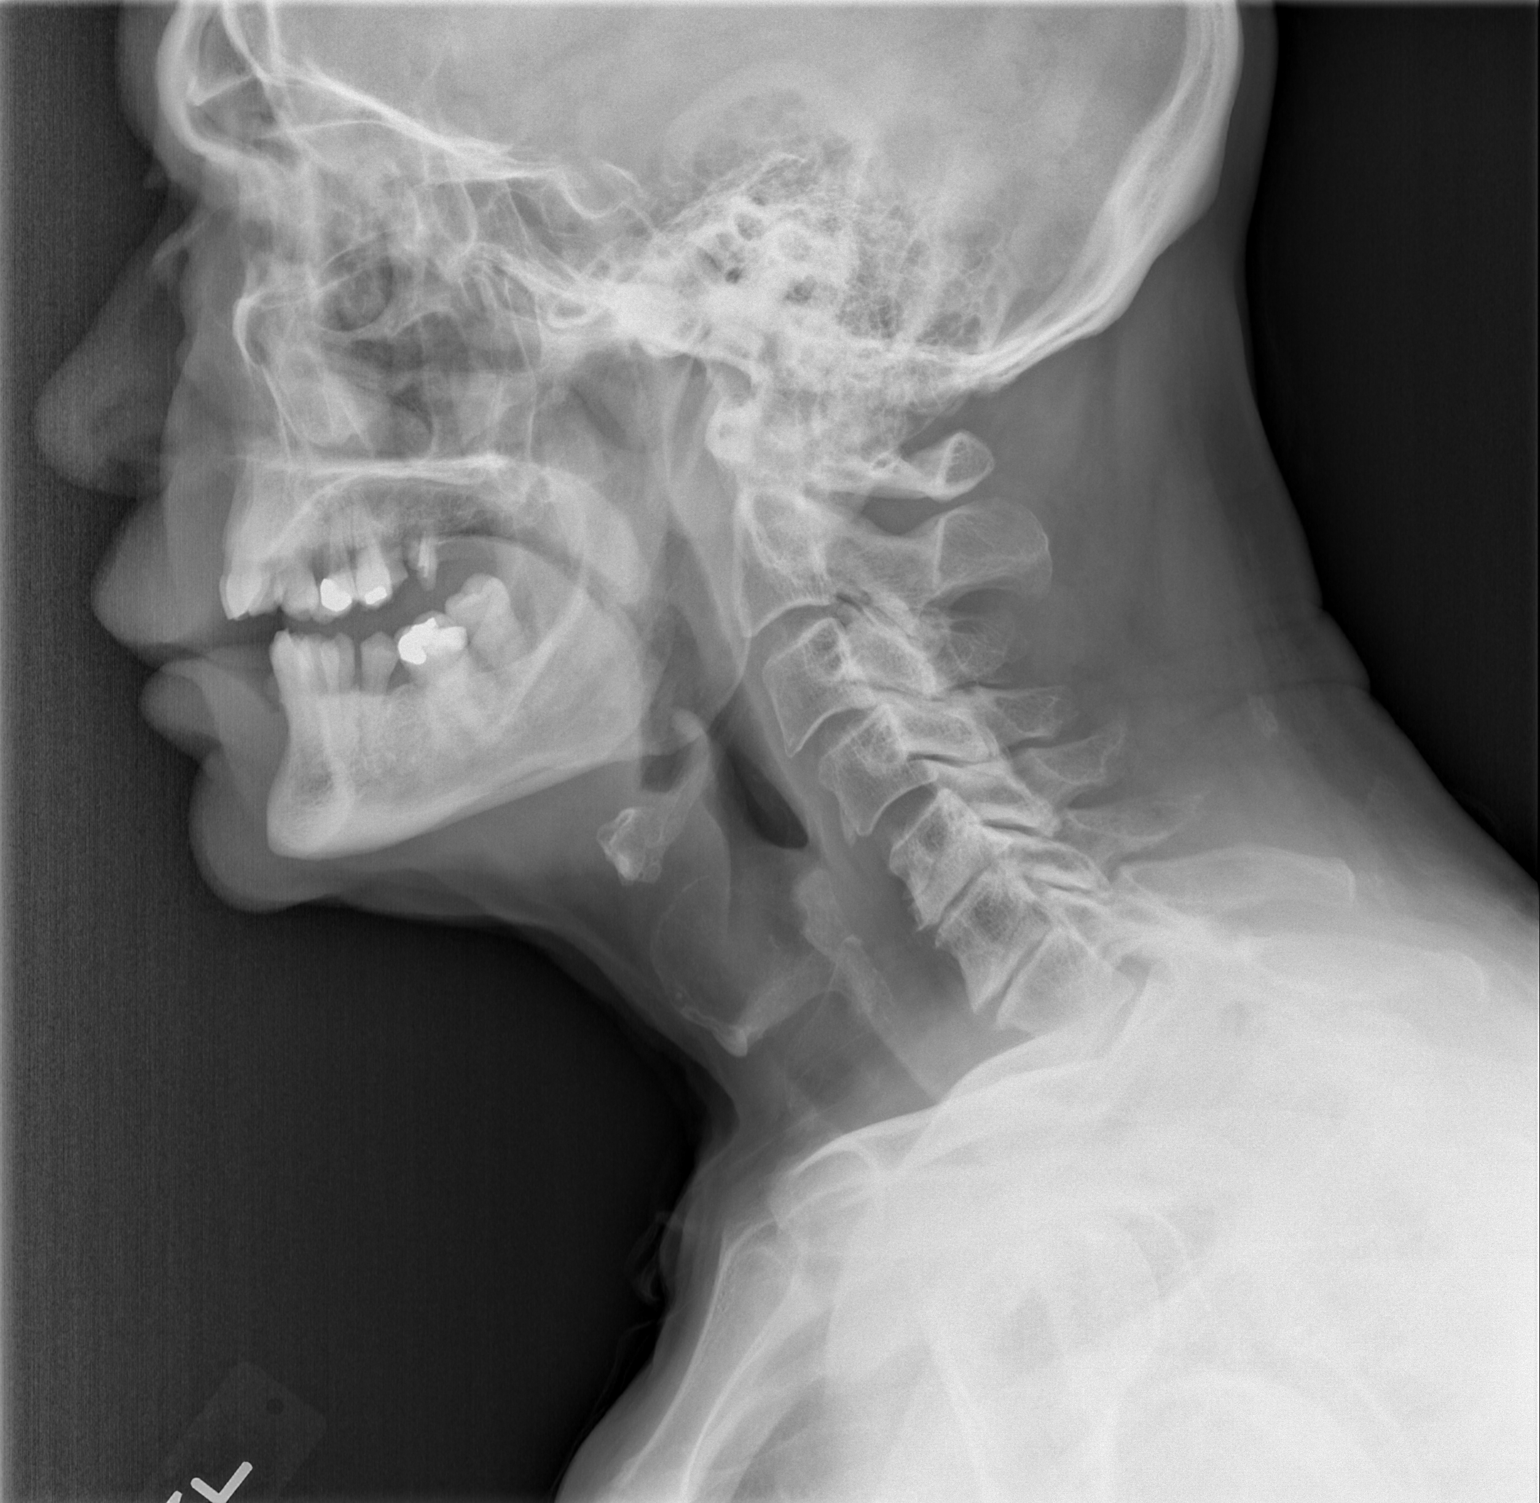

[w soft tissue neck ap]
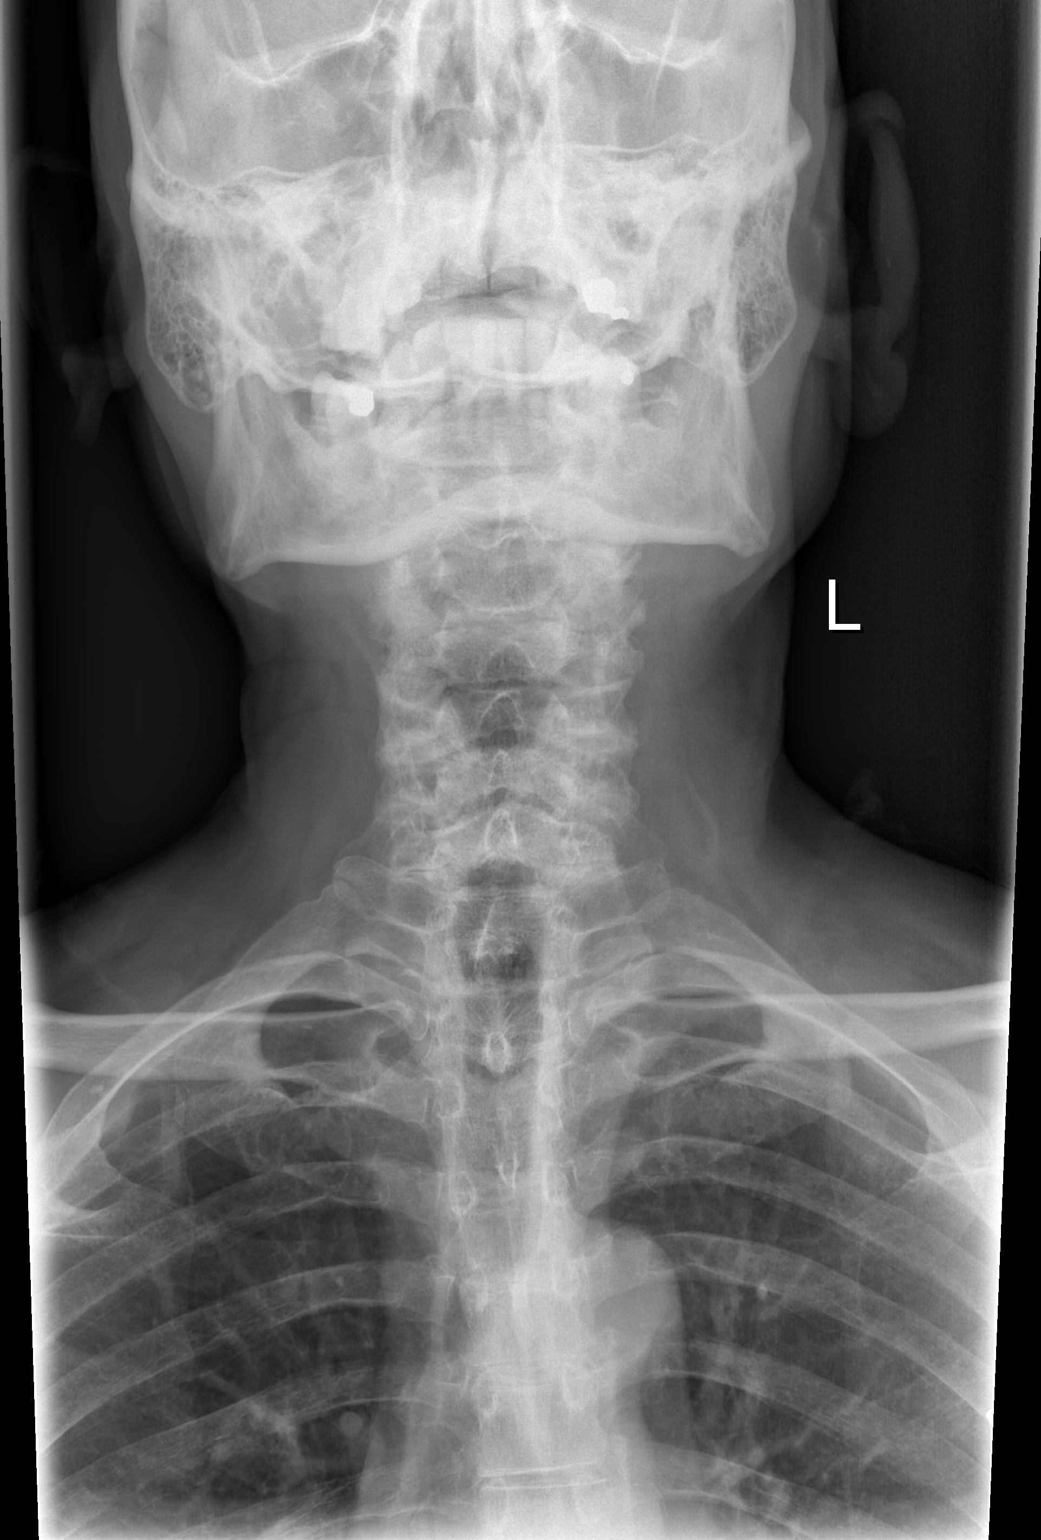

[2 of 2 positions shown; findings below may reference images not displayed]

FINDINGS: There is no evidence of retropharyngeal soft tissue swelling or
epiglottic enlargement. The cervical airway is unremarkable and no
radio-opaque foreign body identified. Incidental note made of mild
degenerative change in the mid and lower cervical spine.
IMPRESSION: No acute findings.

## 2020-02-10 DIAGNOSIS — R972 Elevated prostate specific antigen [PSA]: Secondary | ICD-10-CM | POA: Diagnosis not present

## 2020-04-27 DIAGNOSIS — Z0000 Encounter for general adult medical examination without abnormal findings: Secondary | ICD-10-CM | POA: Diagnosis not present

## 2020-04-27 DIAGNOSIS — D692 Other nonthrombocytopenic purpura: Secondary | ICD-10-CM | POA: Diagnosis not present

## 2020-04-27 DIAGNOSIS — I8310 Varicose veins of unspecified lower extremity with inflammation: Secondary | ICD-10-CM | POA: Diagnosis not present

## 2020-04-27 DIAGNOSIS — E782 Mixed hyperlipidemia: Secondary | ICD-10-CM | POA: Diagnosis not present

## 2020-04-27 DIAGNOSIS — F17200 Nicotine dependence, unspecified, uncomplicated: Secondary | ICD-10-CM | POA: Diagnosis not present

## 2020-04-27 DIAGNOSIS — R7301 Impaired fasting glucose: Secondary | ICD-10-CM | POA: Diagnosis not present

## 2020-04-27 DIAGNOSIS — N182 Chronic kidney disease, stage 2 (mild): Secondary | ICD-10-CM | POA: Diagnosis not present

## 2020-04-27 DIAGNOSIS — K635 Polyp of colon: Secondary | ICD-10-CM | POA: Diagnosis not present

## 2020-04-27 DIAGNOSIS — I129 Hypertensive chronic kidney disease with stage 1 through stage 4 chronic kidney disease, or unspecified chronic kidney disease: Secondary | ICD-10-CM | POA: Diagnosis not present

## 2020-04-27 DIAGNOSIS — R972 Elevated prostate specific antigen [PSA]: Secondary | ICD-10-CM | POA: Diagnosis not present

## 2020-04-27 DIAGNOSIS — G629 Polyneuropathy, unspecified: Secondary | ICD-10-CM | POA: Diagnosis not present

## 2020-04-27 DIAGNOSIS — I714 Abdominal aortic aneurysm, without rupture: Secondary | ICD-10-CM | POA: Diagnosis not present

## 2020-04-28 DIAGNOSIS — K635 Polyp of colon: Secondary | ICD-10-CM | POA: Diagnosis not present

## 2020-05-11 DIAGNOSIS — R972 Elevated prostate specific antigen [PSA]: Secondary | ICD-10-CM | POA: Diagnosis not present

## 2020-06-03 DIAGNOSIS — R3912 Poor urinary stream: Secondary | ICD-10-CM | POA: Diagnosis not present

## 2020-06-03 DIAGNOSIS — N401 Enlarged prostate with lower urinary tract symptoms: Secondary | ICD-10-CM | POA: Diagnosis not present

## 2020-06-03 DIAGNOSIS — R972 Elevated prostate specific antigen [PSA]: Secondary | ICD-10-CM | POA: Diagnosis not present

## 2020-06-03 DIAGNOSIS — R3914 Feeling of incomplete bladder emptying: Secondary | ICD-10-CM | POA: Diagnosis not present

## 2020-06-21 DIAGNOSIS — Z23 Encounter for immunization: Secondary | ICD-10-CM | POA: Diagnosis not present

## 2020-07-01 IMAGING — US US AORTA
1 series · 1 of 1 positions shown · non-contrast
Comparison: 02/22/2017

CLINICAL DATA: Follow-up abdominal aortic aneurysm.

EXAM:
ULTRASOUND OF ABDOMINAL AORTA
TECHNIQUE: Ultrasound examination of the abdominal aorta was performed to
evaluate for abdominal aortic aneurysm.

[Series 1: us aorta · 1 of 1 slices shown]
[im 1/1]
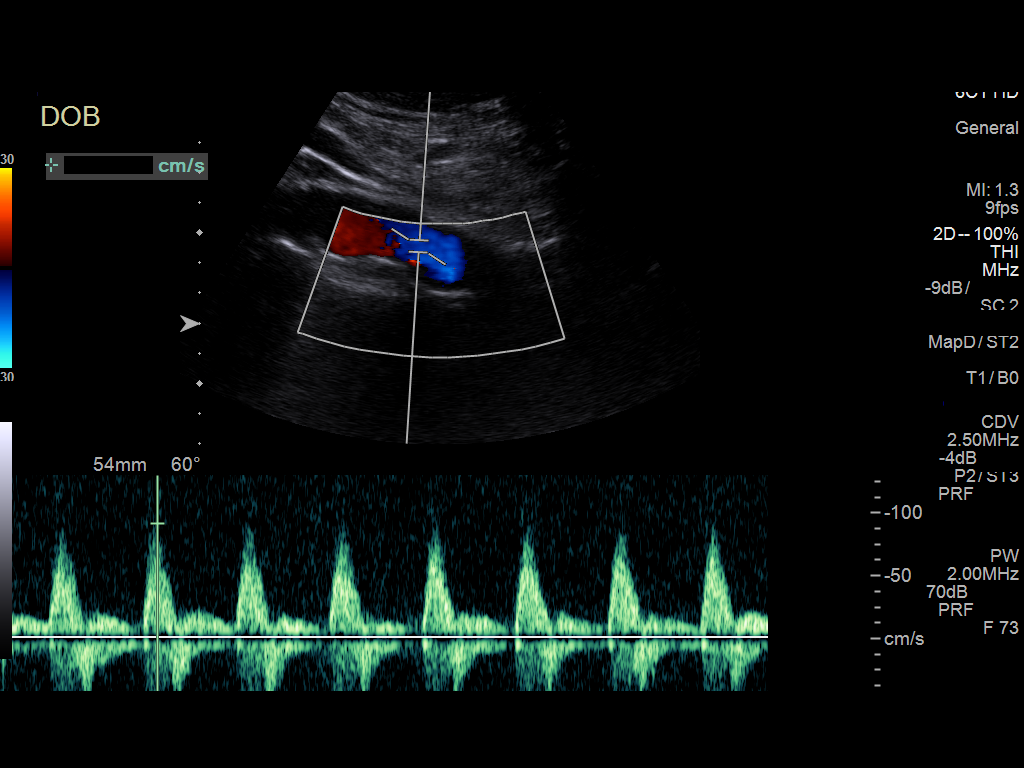

[1 of 1 positions shown; findings below may reference images not displayed]

FINDINGS: Suboptimal evaluation due to overlying bowel gas.

Abdominal aortic measurements as follows:

Proximal:  2.9 cm

Mid:  3.0 cm

Distal:  2.6 cm

Mild atherosclerotic plaque is seen within the abdominal aorta.
Calcified atherosclerotic plaque is seen within the common iliac
arteries bilaterally.
IMPRESSION: No significant change in abdominal aortic aneurysm measuring 3.0 cm.
Recommend followup by ultrasound in 3 years. This recommendation
follows ACR consensus guidelines: White Paper of the ACR Incidental

## 2020-07-05 DIAGNOSIS — R972 Elevated prostate specific antigen [PSA]: Secondary | ICD-10-CM | POA: Diagnosis not present

## 2020-07-05 DIAGNOSIS — N401 Enlarged prostate with lower urinary tract symptoms: Secondary | ICD-10-CM | POA: Diagnosis not present

## 2020-07-05 DIAGNOSIS — R3914 Feeling of incomplete bladder emptying: Secondary | ICD-10-CM | POA: Diagnosis not present

## 2020-07-05 DIAGNOSIS — R3912 Poor urinary stream: Secondary | ICD-10-CM | POA: Diagnosis not present

## 2020-07-05 DIAGNOSIS — R351 Nocturia: Secondary | ICD-10-CM | POA: Diagnosis not present

## 2020-07-26 DIAGNOSIS — M5116 Intervertebral disc disorders with radiculopathy, lumbar region: Secondary | ICD-10-CM | POA: Diagnosis not present

## 2020-07-26 DIAGNOSIS — M5416 Radiculopathy, lumbar region: Secondary | ICD-10-CM | POA: Diagnosis not present

## 2020-07-26 DIAGNOSIS — M4726 Other spondylosis with radiculopathy, lumbar region: Secondary | ICD-10-CM | POA: Diagnosis not present

## 2020-07-26 DIAGNOSIS — M4316 Spondylolisthesis, lumbar region: Secondary | ICD-10-CM | POA: Diagnosis not present

## 2020-07-26 DIAGNOSIS — M5117 Intervertebral disc disorders with radiculopathy, lumbosacral region: Secondary | ICD-10-CM | POA: Diagnosis not present

## 2020-07-26 DIAGNOSIS — M5442 Lumbago with sciatica, left side: Secondary | ICD-10-CM | POA: Diagnosis not present

## 2020-07-26 DIAGNOSIS — M5441 Lumbago with sciatica, right side: Secondary | ICD-10-CM | POA: Diagnosis not present

## 2020-07-26 DIAGNOSIS — M4727 Other spondylosis with radiculopathy, lumbosacral region: Secondary | ICD-10-CM | POA: Diagnosis not present

## 2020-07-26 DIAGNOSIS — G8929 Other chronic pain: Secondary | ICD-10-CM | POA: Diagnosis not present

## 2020-08-11 DIAGNOSIS — M5117 Intervertebral disc disorders with radiculopathy, lumbosacral region: Secondary | ICD-10-CM | POA: Diagnosis not present

## 2020-08-11 DIAGNOSIS — M5116 Intervertebral disc disorders with radiculopathy, lumbar region: Secondary | ICD-10-CM | POA: Diagnosis not present

## 2020-08-11 DIAGNOSIS — M4726 Other spondylosis with radiculopathy, lumbar region: Secondary | ICD-10-CM | POA: Diagnosis not present

## 2020-08-11 DIAGNOSIS — M5416 Radiculopathy, lumbar region: Secondary | ICD-10-CM | POA: Diagnosis not present

## 2020-08-11 DIAGNOSIS — M4727 Other spondylosis with radiculopathy, lumbosacral region: Secondary | ICD-10-CM | POA: Diagnosis not present

## 2020-08-16 DIAGNOSIS — M5442 Lumbago with sciatica, left side: Secondary | ICD-10-CM | POA: Diagnosis not present

## 2020-08-16 DIAGNOSIS — G8929 Other chronic pain: Secondary | ICD-10-CM | POA: Diagnosis not present

## 2020-08-16 DIAGNOSIS — M5441 Lumbago with sciatica, right side: Secondary | ICD-10-CM | POA: Diagnosis not present

## 2020-08-16 DIAGNOSIS — M5416 Radiculopathy, lumbar region: Secondary | ICD-10-CM | POA: Diagnosis not present

## 2020-09-05 DIAGNOSIS — I129 Hypertensive chronic kidney disease with stage 1 through stage 4 chronic kidney disease, or unspecified chronic kidney disease: Secondary | ICD-10-CM | POA: Diagnosis not present

## 2020-10-03 DIAGNOSIS — M5416 Radiculopathy, lumbar region: Secondary | ICD-10-CM | POA: Diagnosis not present

## 2020-10-03 DIAGNOSIS — M7138 Other bursal cyst, other site: Secondary | ICD-10-CM | POA: Diagnosis not present

## 2020-10-05 DIAGNOSIS — I129 Hypertensive chronic kidney disease with stage 1 through stage 4 chronic kidney disease, or unspecified chronic kidney disease: Secondary | ICD-10-CM | POA: Diagnosis not present

## 2020-10-31 DIAGNOSIS — I129 Hypertensive chronic kidney disease with stage 1 through stage 4 chronic kidney disease, or unspecified chronic kidney disease: Secondary | ICD-10-CM | POA: Diagnosis not present

## 2020-10-31 DIAGNOSIS — N182 Chronic kidney disease, stage 2 (mild): Secondary | ICD-10-CM | POA: Diagnosis not present

## 2020-10-31 DIAGNOSIS — E782 Mixed hyperlipidemia: Secondary | ICD-10-CM | POA: Diagnosis not present

## 2020-10-31 DIAGNOSIS — I714 Abdominal aortic aneurysm, without rupture: Secondary | ICD-10-CM | POA: Diagnosis not present

## 2020-10-31 DIAGNOSIS — I8310 Varicose veins of unspecified lower extremity with inflammation: Secondary | ICD-10-CM | POA: Diagnosis not present

## 2020-10-31 DIAGNOSIS — R7301 Impaired fasting glucose: Secondary | ICD-10-CM | POA: Diagnosis not present

## 2020-11-07 DIAGNOSIS — M5441 Lumbago with sciatica, right side: Secondary | ICD-10-CM | POA: Diagnosis not present

## 2020-11-07 DIAGNOSIS — M5416 Radiculopathy, lumbar region: Secondary | ICD-10-CM | POA: Diagnosis not present

## 2020-11-07 DIAGNOSIS — M5442 Lumbago with sciatica, left side: Secondary | ICD-10-CM | POA: Diagnosis not present

## 2020-11-07 DIAGNOSIS — M7138 Other bursal cyst, other site: Secondary | ICD-10-CM | POA: Diagnosis not present

## 2020-11-07 DIAGNOSIS — G8929 Other chronic pain: Secondary | ICD-10-CM | POA: Diagnosis not present

## 2020-12-01 DIAGNOSIS — M7138 Other bursal cyst, other site: Secondary | ICD-10-CM | POA: Diagnosis not present

## 2020-12-01 DIAGNOSIS — M5417 Radiculopathy, lumbosacral region: Secondary | ICD-10-CM | POA: Diagnosis not present

## 2020-12-21 DIAGNOSIS — R972 Elevated prostate specific antigen [PSA]: Secondary | ICD-10-CM | POA: Diagnosis not present

## 2020-12-29 DIAGNOSIS — G894 Chronic pain syndrome: Secondary | ICD-10-CM | POA: Diagnosis not present

## 2020-12-29 DIAGNOSIS — M5442 Lumbago with sciatica, left side: Secondary | ICD-10-CM | POA: Diagnosis not present

## 2020-12-29 DIAGNOSIS — M5416 Radiculopathy, lumbar region: Secondary | ICD-10-CM | POA: Diagnosis not present

## 2020-12-29 DIAGNOSIS — M5417 Radiculopathy, lumbosacral region: Secondary | ICD-10-CM | POA: Diagnosis not present

## 2020-12-29 DIAGNOSIS — M5441 Lumbago with sciatica, right side: Secondary | ICD-10-CM | POA: Diagnosis not present

## 2020-12-29 DIAGNOSIS — M7138 Other bursal cyst, other site: Secondary | ICD-10-CM | POA: Diagnosis not present

## 2020-12-29 DIAGNOSIS — G8929 Other chronic pain: Secondary | ICD-10-CM | POA: Diagnosis not present

## 2021-01-16 DIAGNOSIS — F0634 Mood disorder due to known physiological condition with mixed features: Secondary | ICD-10-CM | POA: Diagnosis not present

## 2021-01-17 DIAGNOSIS — F0634 Mood disorder due to known physiological condition with mixed features: Secondary | ICD-10-CM | POA: Diagnosis not present

## 2021-01-18 DIAGNOSIS — Z23 Encounter for immunization: Secondary | ICD-10-CM | POA: Diagnosis not present

## 2021-01-18 DIAGNOSIS — F0634 Mood disorder due to known physiological condition with mixed features: Secondary | ICD-10-CM | POA: Diagnosis not present

## 2021-01-19 DIAGNOSIS — F0634 Mood disorder due to known physiological condition with mixed features: Secondary | ICD-10-CM | POA: Diagnosis not present

## 2021-01-20 DIAGNOSIS — F0634 Mood disorder due to known physiological condition with mixed features: Secondary | ICD-10-CM | POA: Diagnosis not present

## 2021-02-22 DIAGNOSIS — G894 Chronic pain syndrome: Secondary | ICD-10-CM | POA: Diagnosis not present

## 2021-02-22 DIAGNOSIS — M5441 Lumbago with sciatica, right side: Secondary | ICD-10-CM | POA: Diagnosis not present

## 2021-02-22 DIAGNOSIS — M5417 Radiculopathy, lumbosacral region: Secondary | ICD-10-CM | POA: Diagnosis not present

## 2021-02-22 DIAGNOSIS — M5442 Lumbago with sciatica, left side: Secondary | ICD-10-CM | POA: Diagnosis not present

## 2021-02-22 DIAGNOSIS — G8929 Other chronic pain: Secondary | ICD-10-CM | POA: Diagnosis not present

## 2021-04-14 ENCOUNTER — Other Ambulatory Visit: Payer: Self-pay | Admitting: Family Medicine

## 2021-04-14 DIAGNOSIS — I714 Abdominal aortic aneurysm, without rupture, unspecified: Secondary | ICD-10-CM

## 2021-04-20 ENCOUNTER — Ambulatory Visit
Admission: RE | Admit: 2021-04-20 | Discharge: 2021-04-20 | Disposition: A | Discharge status: Home / Self Care | Payer: Medicare Other | Source: Ambulatory Visit | Attending: Family Medicine | Admitting: Family Medicine

## 2021-04-20 DIAGNOSIS — I714 Abdominal aortic aneurysm, without rupture, unspecified: Secondary | ICD-10-CM

## 2021-05-09 DIAGNOSIS — M5441 Lumbago with sciatica, right side: Secondary | ICD-10-CM | POA: Diagnosis not present

## 2021-05-09 DIAGNOSIS — M5417 Radiculopathy, lumbosacral region: Secondary | ICD-10-CM | POA: Diagnosis not present

## 2021-05-09 DIAGNOSIS — G894 Chronic pain syndrome: Secondary | ICD-10-CM | POA: Diagnosis not present

## 2021-05-09 DIAGNOSIS — M5431 Sciatica, right side: Secondary | ICD-10-CM | POA: Diagnosis not present

## 2021-05-09 DIAGNOSIS — M5442 Lumbago with sciatica, left side: Secondary | ICD-10-CM | POA: Diagnosis not present

## 2021-05-09 DIAGNOSIS — M5432 Sciatica, left side: Secondary | ICD-10-CM | POA: Diagnosis not present

## 2021-05-10 DIAGNOSIS — N182 Chronic kidney disease, stage 2 (mild): Secondary | ICD-10-CM | POA: Diagnosis not present

## 2021-05-10 DIAGNOSIS — F17200 Nicotine dependence, unspecified, uncomplicated: Secondary | ICD-10-CM | POA: Diagnosis not present

## 2021-05-10 DIAGNOSIS — D692 Other nonthrombocytopenic purpura: Secondary | ICD-10-CM | POA: Diagnosis not present

## 2021-05-10 DIAGNOSIS — I714 Abdominal aortic aneurysm, without rupture: Secondary | ICD-10-CM | POA: Diagnosis not present

## 2021-05-10 DIAGNOSIS — R972 Elevated prostate specific antigen [PSA]: Secondary | ICD-10-CM | POA: Diagnosis not present

## 2021-05-10 DIAGNOSIS — K635 Polyp of colon: Secondary | ICD-10-CM | POA: Diagnosis not present

## 2021-05-10 DIAGNOSIS — R7301 Impaired fasting glucose: Secondary | ICD-10-CM | POA: Diagnosis not present

## 2021-05-10 DIAGNOSIS — E782 Mixed hyperlipidemia: Secondary | ICD-10-CM | POA: Diagnosis not present

## 2021-05-10 DIAGNOSIS — M5430 Sciatica, unspecified side: Secondary | ICD-10-CM | POA: Diagnosis not present

## 2021-05-10 DIAGNOSIS — Z0000 Encounter for general adult medical examination without abnormal findings: Secondary | ICD-10-CM | POA: Diagnosis not present

## 2021-05-10 DIAGNOSIS — I129 Hypertensive chronic kidney disease with stage 1 through stage 4 chronic kidney disease, or unspecified chronic kidney disease: Secondary | ICD-10-CM | POA: Diagnosis not present

## 2021-05-10 DIAGNOSIS — I8310 Varicose veins of unspecified lower extremity with inflammation: Secondary | ICD-10-CM | POA: Diagnosis not present

## 2021-05-24 DIAGNOSIS — Z1211 Encounter for screening for malignant neoplasm of colon: Secondary | ICD-10-CM | POA: Diagnosis not present

## 2021-06-03 DIAGNOSIS — Z711 Person with feared health complaint in whom no diagnosis is made: Secondary | ICD-10-CM | POA: Diagnosis not present

## 2021-06-14 DIAGNOSIS — M7138 Other bursal cyst, other site: Secondary | ICD-10-CM | POA: Diagnosis not present

## 2021-06-14 DIAGNOSIS — M5416 Radiculopathy, lumbar region: Secondary | ICD-10-CM | POA: Diagnosis not present

## 2021-06-14 DIAGNOSIS — M5442 Lumbago with sciatica, left side: Secondary | ICD-10-CM | POA: Diagnosis not present

## 2021-06-14 DIAGNOSIS — M5417 Radiculopathy, lumbosacral region: Secondary | ICD-10-CM | POA: Diagnosis not present

## 2021-06-14 DIAGNOSIS — M5441 Lumbago with sciatica, right side: Secondary | ICD-10-CM | POA: Diagnosis not present

## 2021-06-14 DIAGNOSIS — G8929 Other chronic pain: Secondary | ICD-10-CM | POA: Diagnosis not present

## 2021-07-04 DIAGNOSIS — R972 Elevated prostate specific antigen [PSA]: Secondary | ICD-10-CM | POA: Diagnosis not present

## 2021-07-13 DIAGNOSIS — R972 Elevated prostate specific antigen [PSA]: Secondary | ICD-10-CM | POA: Diagnosis not present

## 2021-07-13 DIAGNOSIS — N401 Enlarged prostate with lower urinary tract symptoms: Secondary | ICD-10-CM | POA: Diagnosis not present

## 2021-07-13 DIAGNOSIS — R3912 Poor urinary stream: Secondary | ICD-10-CM | POA: Diagnosis not present

## 2021-07-25 DIAGNOSIS — Z23 Encounter for immunization: Secondary | ICD-10-CM | POA: Diagnosis not present

## 2021-10-26 DIAGNOSIS — H2513 Age-related nuclear cataract, bilateral: Secondary | ICD-10-CM | POA: Diagnosis not present

## 2021-11-09 DIAGNOSIS — N182 Chronic kidney disease, stage 2 (mild): Secondary | ICD-10-CM | POA: Diagnosis not present

## 2021-11-09 DIAGNOSIS — F17200 Nicotine dependence, unspecified, uncomplicated: Secondary | ICD-10-CM | POA: Diagnosis not present

## 2021-11-09 DIAGNOSIS — R7301 Impaired fasting glucose: Secondary | ICD-10-CM | POA: Diagnosis not present

## 2021-11-09 DIAGNOSIS — E782 Mixed hyperlipidemia: Secondary | ICD-10-CM | POA: Diagnosis not present

## 2021-11-09 DIAGNOSIS — I129 Hypertensive chronic kidney disease with stage 1 through stage 4 chronic kidney disease, or unspecified chronic kidney disease: Secondary | ICD-10-CM | POA: Diagnosis not present

## 2021-11-09 DIAGNOSIS — M4800 Spinal stenosis, site unspecified: Secondary | ICD-10-CM | POA: Diagnosis not present

## 2021-11-09 DIAGNOSIS — J019 Acute sinusitis, unspecified: Secondary | ICD-10-CM | POA: Diagnosis not present

## 2021-11-28 DIAGNOSIS — H02403 Unspecified ptosis of bilateral eyelids: Secondary | ICD-10-CM | POA: Diagnosis not present

## 2022-01-15 DIAGNOSIS — R3914 Feeling of incomplete bladder emptying: Secondary | ICD-10-CM | POA: Diagnosis not present

## 2022-01-15 DIAGNOSIS — R972 Elevated prostate specific antigen [PSA]: Secondary | ICD-10-CM | POA: Diagnosis not present

## 2022-01-15 DIAGNOSIS — N401 Enlarged prostate with lower urinary tract symptoms: Secondary | ICD-10-CM | POA: Diagnosis not present

## 2022-03-14 DIAGNOSIS — R231 Pallor: Secondary | ICD-10-CM | POA: Diagnosis not present

## 2022-03-14 DIAGNOSIS — R531 Weakness: Secondary | ICD-10-CM | POA: Diagnosis not present

## 2022-03-14 DIAGNOSIS — R410 Disorientation, unspecified: Secondary | ICD-10-CM | POA: Diagnosis not present

## 2022-03-21 DIAGNOSIS — U071 COVID-19: Secondary | ICD-10-CM | POA: Diagnosis not present

## 2022-05-16 DIAGNOSIS — J189 Pneumonia, unspecified organism: Secondary | ICD-10-CM | POA: Diagnosis not present

## 2022-05-16 DIAGNOSIS — Z03818 Encounter for observation for suspected exposure to other biological agents ruled out: Secondary | ICD-10-CM | POA: Diagnosis not present

## 2022-05-16 DIAGNOSIS — R051 Acute cough: Secondary | ICD-10-CM | POA: Diagnosis not present

## 2022-05-16 DIAGNOSIS — J440 Chronic obstructive pulmonary disease with acute lower respiratory infection: Secondary | ICD-10-CM | POA: Diagnosis not present

## 2022-05-24 DIAGNOSIS — J440 Chronic obstructive pulmonary disease with acute lower respiratory infection: Secondary | ICD-10-CM | POA: Diagnosis not present

## 2022-05-24 DIAGNOSIS — I714 Abdominal aortic aneurysm, without rupture, unspecified: Secondary | ICD-10-CM | POA: Diagnosis not present

## 2022-05-24 DIAGNOSIS — I129 Hypertensive chronic kidney disease with stage 1 through stage 4 chronic kidney disease, or unspecified chronic kidney disease: Secondary | ICD-10-CM | POA: Diagnosis not present

## 2022-05-24 DIAGNOSIS — M5430 Sciatica, unspecified side: Secondary | ICD-10-CM | POA: Diagnosis not present

## 2022-05-24 DIAGNOSIS — F17200 Nicotine dependence, unspecified, uncomplicated: Secondary | ICD-10-CM | POA: Diagnosis not present

## 2022-05-24 DIAGNOSIS — I8310 Varicose veins of unspecified lower extremity with inflammation: Secondary | ICD-10-CM | POA: Diagnosis not present

## 2022-05-24 DIAGNOSIS — D692 Other nonthrombocytopenic purpura: Secondary | ICD-10-CM | POA: Diagnosis not present

## 2022-05-24 DIAGNOSIS — N182 Chronic kidney disease, stage 2 (mild): Secondary | ICD-10-CM | POA: Diagnosis not present

## 2022-05-24 DIAGNOSIS — Z0000 Encounter for general adult medical examination without abnormal findings: Secondary | ICD-10-CM | POA: Diagnosis not present

## 2022-05-24 DIAGNOSIS — R7301 Impaired fasting glucose: Secondary | ICD-10-CM | POA: Diagnosis not present

## 2022-05-24 DIAGNOSIS — N400 Enlarged prostate without lower urinary tract symptoms: Secondary | ICD-10-CM | POA: Diagnosis not present

## 2022-05-24 DIAGNOSIS — K635 Polyp of colon: Secondary | ICD-10-CM | POA: Diagnosis not present

## 2022-05-24 DIAGNOSIS — E782 Mixed hyperlipidemia: Secondary | ICD-10-CM | POA: Diagnosis not present

## 2022-07-10 DIAGNOSIS — R972 Elevated prostate specific antigen [PSA]: Secondary | ICD-10-CM | POA: Diagnosis not present

## 2022-07-17 DIAGNOSIS — R3914 Feeling of incomplete bladder emptying: Secondary | ICD-10-CM | POA: Diagnosis not present

## 2022-07-17 DIAGNOSIS — N401 Enlarged prostate with lower urinary tract symptoms: Secondary | ICD-10-CM | POA: Diagnosis not present

## 2022-07-17 DIAGNOSIS — R972 Elevated prostate specific antigen [PSA]: Secondary | ICD-10-CM | POA: Diagnosis not present

## 2022-08-09 DIAGNOSIS — Z79899 Other long term (current) drug therapy: Secondary | ICD-10-CM | POA: Diagnosis not present

## 2022-08-09 DIAGNOSIS — I10 Essential (primary) hypertension: Secondary | ICD-10-CM | POA: Diagnosis not present

## 2022-08-09 DIAGNOSIS — E785 Hyperlipidemia, unspecified: Secondary | ICD-10-CM | POA: Diagnosis not present

## 2022-08-09 DIAGNOSIS — J449 Chronic obstructive pulmonary disease, unspecified: Secondary | ICD-10-CM | POA: Diagnosis not present

## 2022-08-09 DIAGNOSIS — H02831 Dermatochalasis of right upper eyelid: Secondary | ICD-10-CM | POA: Diagnosis not present

## 2022-08-09 DIAGNOSIS — Z7982 Long term (current) use of aspirin: Secondary | ICD-10-CM | POA: Diagnosis not present

## 2022-08-09 DIAGNOSIS — H02834 Dermatochalasis of left upper eyelid: Secondary | ICD-10-CM | POA: Diagnosis not present

## 2022-08-09 DIAGNOSIS — Z888 Allergy status to other drugs, medicaments and biological substances status: Secondary | ICD-10-CM | POA: Diagnosis not present

## 2022-09-05 DIAGNOSIS — Z1211 Encounter for screening for malignant neoplasm of colon: Secondary | ICD-10-CM | POA: Diagnosis not present

## 2022-11-13 DIAGNOSIS — G243 Spasmodic torticollis: Secondary | ICD-10-CM | POA: Diagnosis not present

## 2022-11-26 DIAGNOSIS — R7301 Impaired fasting glucose: Secondary | ICD-10-CM | POA: Diagnosis not present

## 2022-11-26 DIAGNOSIS — F17200 Nicotine dependence, unspecified, uncomplicated: Secondary | ICD-10-CM | POA: Diagnosis not present

## 2022-11-26 DIAGNOSIS — N182 Chronic kidney disease, stage 2 (mild): Secondary | ICD-10-CM | POA: Diagnosis not present

## 2022-11-26 DIAGNOSIS — D692 Other nonthrombocytopenic purpura: Secondary | ICD-10-CM | POA: Diagnosis not present

## 2022-11-26 DIAGNOSIS — I129 Hypertensive chronic kidney disease with stage 1 through stage 4 chronic kidney disease, or unspecified chronic kidney disease: Secondary | ICD-10-CM | POA: Diagnosis not present

## 2022-11-26 DIAGNOSIS — E782 Mixed hyperlipidemia: Secondary | ICD-10-CM | POA: Diagnosis not present

## 2022-11-26 DIAGNOSIS — M4800 Spinal stenosis, site unspecified: Secondary | ICD-10-CM | POA: Diagnosis not present

## 2022-12-13 DIAGNOSIS — I129 Hypertensive chronic kidney disease with stage 1 through stage 4 chronic kidney disease, or unspecified chronic kidney disease: Secondary | ICD-10-CM | POA: Diagnosis not present

## 2022-12-13 DIAGNOSIS — N182 Chronic kidney disease, stage 2 (mild): Secondary | ICD-10-CM | POA: Diagnosis not present

## 2022-12-20 DIAGNOSIS — E871 Hypo-osmolality and hyponatremia: Secondary | ICD-10-CM | POA: Diagnosis not present

## 2023-01-09 DIAGNOSIS — R972 Elevated prostate specific antigen [PSA]: Secondary | ICD-10-CM | POA: Diagnosis not present

## 2023-01-16 DIAGNOSIS — R972 Elevated prostate specific antigen [PSA]: Secondary | ICD-10-CM | POA: Diagnosis not present

## 2023-01-16 DIAGNOSIS — N401 Enlarged prostate with lower urinary tract symptoms: Secondary | ICD-10-CM | POA: Diagnosis not present

## 2023-01-16 DIAGNOSIS — R3912 Poor urinary stream: Secondary | ICD-10-CM | POA: Diagnosis not present

## 2023-02-02 DIAGNOSIS — R300 Dysuria: Secondary | ICD-10-CM | POA: Diagnosis not present

## 2023-02-02 DIAGNOSIS — K5900 Constipation, unspecified: Secondary | ICD-10-CM | POA: Diagnosis not present

## 2023-02-06 ENCOUNTER — Encounter (HOSPITAL_BASED_OUTPATIENT_CLINIC_OR_DEPARTMENT_OTHER): Payer: Self-pay | Admitting: Emergency Medicine

## 2023-02-06 ENCOUNTER — Emergency Department (HOSPITAL_BASED_OUTPATIENT_CLINIC_OR_DEPARTMENT_OTHER)
Admission: EM | Admit: 2023-02-06 | Discharge: 2023-02-06 | Disposition: A | Discharge status: Home / Self Care | Payer: Medicare Other | Attending: Emergency Medicine | Admitting: Emergency Medicine

## 2023-02-06 DIAGNOSIS — K5900 Constipation, unspecified: Secondary | ICD-10-CM | POA: Diagnosis not present

## 2023-02-06 DIAGNOSIS — I10 Essential (primary) hypertension: Secondary | ICD-10-CM | POA: Insufficient documentation

## 2023-02-06 NOTE — ED Triage Notes (Signed)
Pt states has been having constipation for about 3 weeks, has been seen at [facility] and given stool softeners, suppositories and enema. Has only had small BM's.

## 2023-02-06 NOTE — ED Notes (Signed)
ED Provider at bedside. 

## 2023-02-06 NOTE — ED Notes (Signed)
Enema given. Awaiting results

## 2023-02-06 NOTE — ED Notes (Signed)
Pt sitting on toilet with some gas coming out and feeling like he is able to have a bowel movement. Discharge instructions given to pt and family.

## 2023-02-06 NOTE — ED Provider Notes (Signed)
[place] [place] [facility] Provider Note   CSN: [id number] Arrival date & time: 02/06/23  4696     History  Chief Complaint  Patient presents with   Constipation    Sean Austin is a 72 y.o. male.  Patient here with constipation.  Denies any abdominal pain, nausea, vomiting.  He is passing gas.  He is passing small stool balls since starting some docusate and doing the enema here last few days.  Has a history of hypertension.  Denies any major abdominal surgery.  Over the last 2 to 3 weeks his stool has become more hard and he is only passing small hard stools.  However he is not having any discomfort or pain.  Denies any chest pain or shortness of breath.  The history is provided by the patient.       Home Medications Prior to Admission medications   Medication Sig Start Date End Date Taking? Authorizing Provider  AMLODIPINE BESYLATE PO Take 10 mg by mouth.    [provider]  atenolol (TENORMIN) 50 MG tablet Take 50 mg by mouth daily.    [provider]  famotidine (PEPCID) 40 MG tablet Take 1 tablet (40 mg total) by mouth daily. 01/26/18   Petrucelli, Samantha R, PA-C  losartan (COZAAR) 50 MG tablet Take 50 mg by mouth daily.    [provider]  predniSONE (DELTASONE) 50 MG tablet Take 1 tablet (50 mg total) by mouth daily with breakfast. 01/26/18   Petrucelli, Samantha R, PA-C  simvastatin (ZOCOR) 40 MG tablet Take 40 mg by mouth daily.    [provider]      Allergies    Amlodipine and Ace inhibitors    Review of Systems   Review of Systems  Physical Exam Updated Vital Signs BP 137/77 (BP Location: Right Arm)   Pulse 90   Temp 97.8 F (36.6 C) (Oral)   Resp 20   Ht 6\' 1"  (1.854 m)   Wt 88.5 kg   SpO2 97%   BMI 25.73 kg/m  Physical Exam Vitals and nursing note reviewed. Exam conducted with a chaperone present.  Constitutional:      General: He is not in acute distress.    Appearance: He is  well-developed. He is not ill-appearing.  HENT:     Head: Normocephalic and atraumatic.     Nose: Nose normal.     Mouth/Throat:     Mouth: Mucous membranes are moist.  Eyes:     Extraocular Movements: Extraocular movements intact.     Conjunctiva/sclera: Conjunctivae normal.     Pupils: Pupils are equal, round, and reactive to light.  Cardiovascular:     Rate and Rhythm: Normal rate and regular rhythm.     Pulses: Normal pulses.     Heart sounds: Normal heart sounds. No murmur heard. Pulmonary:     Effort: Pulmonary effort is normal. No respiratory distress.     Breath sounds: Normal breath sounds.  Abdominal:     Palpations: Abdomen is soft.     Tenderness: There is no abdominal tenderness.  Genitourinary:    Comments: Rectal exam is unremarkable Musculoskeletal:        General: No swelling.     Cervical back: Normal range of motion and neck supple.  Skin:    General: Skin is warm and dry.     Capillary Refill: Capillary refill takes less than 2 seconds.  Neurological:     Mental Status: He is alert.  Psychiatric:        Mood and Affect: Mood normal.     ED Results / Procedures / Treatments   Labs (all labs ordered are listed, but only abnormal results are displayed) Labs Reviewed - No data to display  EKG None  Radiology No results found.  Procedures Procedures    Medications Ordered in ED Medications - No data to display  ED Course/ Medical Decision Making/ A&P                             Medical Decision Making  Sean Austin is here with constipation.  History of hypertension.  Normal vitals.  No fever.  Very well-appearing.  No abdominal tenderness.  No nausea vomiting.  He has no obstructive symptoms.  He is well-appearing.  Rectal exam was unremarkable.  Not having any rectal pain.  No history of abdominal surgery.  Has had a colonoscopy before.  Overall have low suspicion for obstructive process.  Decision was made to do another enema.  He has not  done any MiraLAX and we will educate him on MiraLAX use.  He understands to return if he develops abdominal pain, nausea, vomiting as then we may need to do a CT scan to further evaluate.  At this time he is asymptomatic.  He does not have any discomfort.  Have no concern for other acute cardiac or abdominal process.  Discharged in good condition.  Recommend close follow-up with primary care doctor.  This chart was dictated using voice recognition software.  Despite best efforts to proofread,  errors can occur which can change the documentation meaning.         Final Clinical Impression(s) / ED Diagnoses Final diagnoses:  Constipation, unspecified constipation type    Rx / DC Orders ED Discharge Orders     None         Virgina Norfolk, DO 02/06/23 1610

## 2023-02-06 NOTE — Discharge Instructions (Signed)
I recommend purchasing MiraLAX over-the-counter.  I recommend using 4-5 capfuls of MiraLAX in 20 to 30 ounces of water.  Mix this solution together and drink over the course of a few hours.  You can titrate this medicine to affect daily.  You can continue docusate as well.

## 2023-03-19 ENCOUNTER — Inpatient Hospital Stay (HOSPITAL_COMMUNITY): Payer: Medicare Other

## 2023-03-19 ENCOUNTER — Inpatient Hospital Stay (HOSPITAL_COMMUNITY)
Admission: EM | Admit: 2023-03-19 | Discharge: 2023-03-23 | DRG: 472 | Disposition: A | Discharge status: Home Health | Payer: Medicare Other | Attending: Internal Medicine | Admitting: Internal Medicine

## 2023-03-19 ENCOUNTER — Encounter (HOSPITAL_COMMUNITY): Payer: Self-pay

## 2023-03-19 ENCOUNTER — Emergency Department (HOSPITAL_COMMUNITY): Payer: Medicare Other

## 2023-03-19 ENCOUNTER — Other Ambulatory Visit: Payer: Self-pay

## 2023-03-19 DIAGNOSIS — I10 Essential (primary) hypertension: Secondary | ICD-10-CM | POA: Diagnosis present

## 2023-03-19 DIAGNOSIS — Z9682 Presence of neurostimulator: Secondary | ICD-10-CM

## 2023-03-19 DIAGNOSIS — R946 Abnormal results of thyroid function studies: Secondary | ICD-10-CM | POA: Diagnosis present

## 2023-03-19 DIAGNOSIS — R200 Anesthesia of skin: Secondary | ICD-10-CM

## 2023-03-19 DIAGNOSIS — Z888 Allergy status to other drugs, medicaments and biological substances status: Secondary | ICD-10-CM | POA: Diagnosis not present

## 2023-03-19 DIAGNOSIS — R634 Abnormal weight loss: Secondary | ICD-10-CM | POA: Diagnosis not present

## 2023-03-19 DIAGNOSIS — E871 Hypo-osmolality and hyponatremia: Secondary | ICD-10-CM | POA: Diagnosis not present

## 2023-03-19 DIAGNOSIS — R29818 Other symptoms and signs involving the nervous system: Secondary | ICD-10-CM | POA: Diagnosis not present

## 2023-03-19 DIAGNOSIS — K5909 Other constipation: Secondary | ICD-10-CM | POA: Diagnosis present

## 2023-03-19 DIAGNOSIS — Z981 Arthrodesis status: Secondary | ICD-10-CM | POA: Diagnosis not present

## 2023-03-19 DIAGNOSIS — N400 Enlarged prostate without lower urinary tract symptoms: Secondary | ICD-10-CM | POA: Diagnosis not present

## 2023-03-19 DIAGNOSIS — G959 Disease of spinal cord, unspecified: Secondary | ICD-10-CM | POA: Insufficient documentation

## 2023-03-19 DIAGNOSIS — E785 Hyperlipidemia, unspecified: Secondary | ICD-10-CM | POA: Diagnosis not present

## 2023-03-19 DIAGNOSIS — Z79899 Other long term (current) drug therapy: Secondary | ICD-10-CM | POA: Diagnosis not present

## 2023-03-19 DIAGNOSIS — G9520 Unspecified cord compression: Secondary | ICD-10-CM | POA: Diagnosis not present

## 2023-03-19 DIAGNOSIS — F1010 Alcohol abuse, uncomplicated: Secondary | ICD-10-CM | POA: Diagnosis present

## 2023-03-19 DIAGNOSIS — R9089 Other abnormal findings on diagnostic imaging of central nervous system: Secondary | ICD-10-CM | POA: Diagnosis not present

## 2023-03-19 DIAGNOSIS — M6281 Muscle weakness (generalized): Secondary | ICD-10-CM | POA: Diagnosis not present

## 2023-03-19 DIAGNOSIS — M542 Cervicalgia: Secondary | ICD-10-CM | POA: Diagnosis not present

## 2023-03-19 DIAGNOSIS — S2242XA Multiple fractures of ribs, left side, initial encounter for closed fracture: Secondary | ICD-10-CM | POA: Diagnosis not present

## 2023-03-19 DIAGNOSIS — F17210 Nicotine dependence, cigarettes, uncomplicated: Secondary | ICD-10-CM | POA: Diagnosis present

## 2023-03-19 DIAGNOSIS — R29898 Other symptoms and signs involving the musculoskeletal system: Secondary | ICD-10-CM | POA: Diagnosis not present

## 2023-03-19 DIAGNOSIS — E0781 Sick-euthyroid syndrome: Secondary | ICD-10-CM | POA: Diagnosis present

## 2023-03-19 DIAGNOSIS — R202 Paresthesia of skin: Secondary | ICD-10-CM | POA: Diagnosis not present

## 2023-03-19 DIAGNOSIS — M50221 Other cervical disc displacement at C4-C5 level: Secondary | ICD-10-CM | POA: Diagnosis not present

## 2023-03-19 DIAGNOSIS — Z6825 Body mass index (BMI) 25.0-25.9, adult: Secondary | ICD-10-CM | POA: Diagnosis not present

## 2023-03-19 DIAGNOSIS — K5730 Diverticulosis of large intestine without perforation or abscess without bleeding: Secondary | ICD-10-CM | POA: Diagnosis not present

## 2023-03-19 DIAGNOSIS — R531 Weakness: Secondary | ICD-10-CM | POA: Diagnosis not present

## 2023-03-19 DIAGNOSIS — M4712 Other spondylosis with myelopathy, cervical region: Secondary | ICD-10-CM | POA: Diagnosis present

## 2023-03-19 DIAGNOSIS — M2548 Effusion, other site: Secondary | ICD-10-CM | POA: Diagnosis not present

## 2023-03-19 DIAGNOSIS — M50021 Cervical disc disorder at C4-C5 level with myelopathy: Secondary | ICD-10-CM | POA: Diagnosis not present

## 2023-03-19 DIAGNOSIS — M419 Scoliosis, unspecified: Secondary | ICD-10-CM | POA: Diagnosis not present

## 2023-03-19 DIAGNOSIS — J40 Bronchitis, not specified as acute or chronic: Secondary | ICD-10-CM | POA: Diagnosis present

## 2023-03-19 DIAGNOSIS — M47816 Spondylosis without myelopathy or radiculopathy, lumbar region: Secondary | ICD-10-CM | POA: Diagnosis not present

## 2023-03-19 DIAGNOSIS — M50022 Cervical disc disorder at C5-C6 level with myelopathy: Principal | ICD-10-CM | POA: Diagnosis present

## 2023-03-19 DIAGNOSIS — M4316 Spondylolisthesis, lumbar region: Secondary | ICD-10-CM | POA: Diagnosis not present

## 2023-03-19 DIAGNOSIS — M5023 Other cervical disc displacement, cervicothoracic region: Secondary | ICD-10-CM | POA: Diagnosis not present

## 2023-03-19 DIAGNOSIS — M4802 Spinal stenosis, cervical region: Secondary | ICD-10-CM | POA: Diagnosis present

## 2023-03-19 DIAGNOSIS — M5137 Other intervertebral disc degeneration, lumbosacral region: Secondary | ICD-10-CM | POA: Diagnosis not present

## 2023-03-19 LAB — RAPID HIV SCREEN (HIV 1/2 AB+AG)
HIV 1/2 Antibodies: NONREACTIVE
HIV-1 P24 Antigen - HIV24: NONREACTIVE

## 2023-03-19 LAB — I-STAT CG4 LACTIC ACID, ED
Lactic Acid, Venous: 0.8 mmol/L (ref 0.5–1.9)
Lactic Acid, Venous: 0.6 mmol/L (ref 0.5–1.9)

## 2023-03-19 LAB — CBC WITH DIFFERENTIAL/PLATELET
Abs Immature Granulocytes: 0.01 10*3/uL (ref 0.00–0.07)
Basophils Absolute: 0.1 10*3/uL (ref 0.0–0.1)
Basophils Relative: 1 %
Eosinophils Absolute: 0.2 10*3/uL (ref 0.0–0.5)
Eosinophils Relative: 3 %
HCT: 38 % — ABNORMAL LOW (ref 39.0–52.0)
Hemoglobin: 13 g/dL (ref 13.0–17.0)
Immature Granulocytes: 0 %
Lymphocytes Relative: 27 %
Lymphs Abs: 1.7 10*3/uL (ref 0.7–4.0)
MCH: 32.6 pg (ref 26.0–34.0)
MCHC: 34.2 g/dL (ref 30.0–36.0)
MCV: 95.2 fL (ref 80.0–100.0)
Monocytes Absolute: 0.5 10*3/uL (ref 0.1–1.0)
Monocytes Relative: 8 %
Neutro Abs: 4 10*3/uL (ref 1.7–7.7)
Neutrophils Relative %: 61 %
Platelets: 269 10*3/uL (ref 150–400)
RBC: 3.99 MIL/uL — ABNORMAL LOW (ref 4.22–5.81)
RDW: 11.4 % — ABNORMAL LOW (ref 11.5–15.5)
WBC: 6.4 10*3/uL (ref 4.0–10.5)
nRBC: 0 % (ref 0.0–0.2)

## 2023-03-19 LAB — COMPREHENSIVE METABOLIC PANEL
ALT: 15 U/L (ref 0–44)
AST: 22 U/L (ref 15–41)
Albumin: 3.8 g/dL (ref 3.5–5.0)
Alkaline Phosphatase: 73 U/L (ref 38–126)
Anion gap: 9 (ref 5–15)
BUN: 12 mg/dL (ref 8–23)
CO2: 25 mmol/L (ref 22–32)
Calcium: 9 mg/dL (ref 8.9–10.3)
Chloride: 101 mmol/L (ref 98–111)
Creatinine, Ser: 0.79 mg/dL (ref 0.61–1.24)
GFR, Estimated: >60 mL/min (ref >60)
Glucose, Bld: 134 mg/dL — ABNORMAL HIGH (ref 70–99)
Potassium: 3.6 mmol/L (ref 3.5–5.1)
Sodium: 135 mmol/L (ref 135–145)
Total Bilirubin: 0.6 mg/dL (ref 0.3–1.2)
Total Protein: 6.8 g/dL (ref 6.5–8.1)

## 2023-03-19 LAB — URINALYSIS, ROUTINE W REFLEX MICROSCOPIC
Bilirubin Urine: NEGATIVE
Glucose, UA: NEGATIVE mg/dL
Hgb urine dipstick: NEGATIVE
Ketones, ur: 5 mg/dL — AB
Leukocytes,Ua: NEGATIVE
Nitrite: NEGATIVE
Protein, ur: NEGATIVE mg/dL
Specific Gravity, Urine: 1.014 (ref 1.005–1.030)
pH: 5 (ref 5.0–8.0)

## 2023-03-19 LAB — MAGNESIUM: Magnesium: 2 mg/dL (ref 1.7–2.4)

## 2023-03-19 LAB — SEDIMENTATION RATE: Sed Rate: 29 mm/hr — ABNORMAL HIGH (ref 0–16)

## 2023-03-19 LAB — TSH: TSH: 8.735 u[IU]/mL — ABNORMAL HIGH (ref 0.350–4.500)

## 2023-03-19 LAB — FOLATE: Folate: 15.5 ng/mL (ref >5.9)

## 2023-03-19 LAB — CK: Total CK: 194 U/L (ref 49–397)

## 2023-03-19 MED ORDER — DILTIAZEM HCL ER COATED BEADS 180 MG PO CP24
180.0000 mg | ORAL_CAPSULE | Freq: Every day | ORAL | Status: DC
  Administered 2023-03-20 – 2023-03-23 (×4): 180 mg via ORAL
  Filled 2023-03-19 (×4): qty 1

## 2023-03-19 MED ORDER — HYDROCODONE-ACETAMINOPHEN 5-325 MG PO TABS
1.0000 | ORAL_TABLET | ORAL | Status: DC | PRN
  Administered 2023-03-19 – 2023-03-21 (×2): 2 via ORAL
  Administered 2023-03-21: 1 via ORAL
  Filled 2023-03-19 (×2): qty 2
  Filled 2023-03-19: qty 1

## 2023-03-19 MED ORDER — FOLIC ACID 400 MCG PO TABS: 400.0000 ug | ORAL_TABLET | Freq: Every day | ORAL | Status: DC

## 2023-03-19 MED ORDER — ACETAMINOPHEN 650 MG RE SUPP: 650.0000 mg | Freq: Four times a day (QID) | RECTAL | Status: DC | PRN

## 2023-03-19 MED ORDER — ENSURE ENLIVE PO LIQD
237.0000 mL | Freq: Two times a day (BID) | ORAL | Status: DC
  Administered 2023-03-23: 237 mL via ORAL

## 2023-03-19 MED ORDER — LORAZEPAM 1 MG PO TABS: 1.0000 mg | ORAL_TABLET | ORAL | Status: AC | PRN

## 2023-03-19 MED ORDER — ACETAMINOPHEN 325 MG PO TABS: 650.0000 mg | ORAL_TABLET | Freq: Four times a day (QID) | ORAL | Status: DC | PRN

## 2023-03-19 MED ORDER — TRAZODONE HCL 50 MG PO TABS: 25.0000 mg | ORAL_TABLET | Freq: Every evening | ORAL | Status: DC | PRN

## 2023-03-19 MED ORDER — SIMVASTATIN 20 MG PO TABS: 40.0000 mg | ORAL_TABLET | Freq: Every day | ORAL | Status: DC

## 2023-03-19 MED ORDER — ATORVASTATIN CALCIUM 10 MG PO TABS
20.0000 mg | ORAL_TABLET | Freq: Every day | ORAL | Status: DC
  Administered 2023-03-19 – 2023-03-22 (×3): 20 mg via ORAL
  Filled 2023-03-19 (×3): qty 2

## 2023-03-19 MED ORDER — SODIUM CHLORIDE 0.9% FLUSH
3.0000 mL | Freq: Two times a day (BID) | INTRAVENOUS | Status: DC
  Administered 2023-03-19 – 2023-03-23 (×7): 3 mL via INTRAVENOUS

## 2023-03-19 MED ORDER — ONDANSETRON HCL 4 MG PO TABS: 4.0000 mg | ORAL_TABLET | Freq: Four times a day (QID) | ORAL | Status: DC | PRN

## 2023-03-19 MED ORDER — ASPIRIN 81 MG PO CHEW
81.0000 mg | CHEWABLE_TABLET | Freq: Every day | ORAL | Status: DC
  Administered 2023-03-20 – 2023-03-23 (×4): 81 mg via ORAL
  Filled 2023-03-19 (×4): qty 1

## 2023-03-19 MED ORDER — HYDRALAZINE HCL 20 MG/ML IJ SOLN: 5.0000 mg | Freq: Three times a day (TID) | INTRAMUSCULAR | Status: DC | PRN

## 2023-03-19 MED ORDER — LORAZEPAM 2 MG/ML IJ SOLN: 1.0000 mg | INTRAMUSCULAR | Status: AC | PRN

## 2023-03-19 MED ORDER — HEPARIN SODIUM (PORCINE) 5000 UNIT/ML IJ SOLN: 5000.0000 [IU] | Freq: Three times a day (TID) | INTRAMUSCULAR | Status: DC

## 2023-03-19 MED ORDER — MORPHINE SULFATE (PF) 2 MG/ML IV SOLN: 1.0000 mg | Freq: Four times a day (QID) | INTRAVENOUS | Status: DC | PRN

## 2023-03-19 MED ORDER — ONDANSETRON HCL 4 MG/2ML IJ SOLN: 4.0000 mg | Freq: Four times a day (QID) | INTRAMUSCULAR | Status: DC | PRN

## 2023-03-19 MED ORDER — DILTIAZEM HCL ER 180 MG PO CP24: 180.0000 mg | ORAL_CAPSULE | Freq: Every day | ORAL | Status: DC

## 2023-03-19 MED ORDER — ADULT MULTIVITAMIN W/MINERALS CH
1.0000 | ORAL_TABLET | Freq: Every day | ORAL | Status: DC
  Administered 2023-03-19 – 2023-03-23 (×5): 1 via ORAL
  Filled 2023-03-19 (×5): qty 1

## 2023-03-19 MED ORDER — THIAMINE HCL 100 MG/ML IJ SOLN: 100.0000 mg | Freq: Every day | INTRAMUSCULAR | Status: DC

## 2023-03-19 MED ORDER — TAMSULOSIN HCL 0.4 MG PO CAPS
0.8000 mg | ORAL_CAPSULE | Freq: Every day | ORAL | Status: DC
  Administered 2023-03-19 – 2023-03-22 (×3): 0.8 mg via ORAL
  Filled 2023-03-19 (×3): qty 2

## 2023-03-19 MED ORDER — FOLIC ACID 1 MG PO TABS
1.0000 mg | ORAL_TABLET | Freq: Every day | ORAL | Status: DC
  Administered 2023-03-19 – 2023-03-23 (×5): 1 mg via ORAL
  Filled 2023-03-19 (×5): qty 1

## 2023-03-19 MED ORDER — BISACODYL 5 MG PO TBEC: 5.0000 mg | DELAYED_RELEASE_TABLET | Freq: Every day | ORAL | Status: DC | PRN

## 2023-03-19 MED ORDER — IRBESARTAN 300 MG PO TABS
300.0000 mg | ORAL_TABLET | Freq: Every day | ORAL | Status: DC
  Administered 2023-03-19 – 2023-03-22 (×3): 300 mg via ORAL
  Filled 2023-03-19 (×3): qty 1

## 2023-03-19 MED ORDER — THIAMINE MONONITRATE 100 MG PO TABS
100.0000 mg | ORAL_TABLET | Freq: Every day | ORAL | Status: DC
  Administered 2023-03-19 – 2023-03-23 (×5): 100 mg via ORAL
  Filled 2023-03-19 (×5): qty 1

## 2023-03-19 MED ORDER — SENNOSIDES-DOCUSATE SODIUM 8.6-50 MG PO TABS: 1.0000 | ORAL_TABLET | Freq: Every evening | ORAL | Status: DC | PRN

## 2023-03-19 NOTE — H&P (Signed)
History and Physical   [facility] [place] [facility] Admission History and Physical [employer], D.O.    Patient Name: Sean Austin MR#: [id number] Date of Birth: 04-07-1951 Date of Admission: 03/19/2023  Referring MD/NP/PA: Dr. Rush Landmark Primary Care Physician: Lupita Raider, MD  Chief Complaint:  Chief Complaint  Patient presents with   Weakness    HPI: Sean Austin is a 72 y.o. male with a known history of hypertension, EtOH use low back pain status post spinal cord stimulator placed in 2022 presents to the emergency department for evaluation of Sean Austin.  Patient was in a usual state of health until 6 weeks ago he was seen here in the emergency department for a 2 to 3-week history of constipation after discharge he took MiraLAX several days in a row and then when that did not work he took 7 doses all at once.  After that he started developing electric shock type symptoms radiating across his back and abdomen that lasted for several weeks.  That was also the onset of progressively worsening ascending weakness that started in his lower extremities is bilateral but asymmetric and is progressing to affect both arms the point where he cannot lift his arms over his head.Marland Kitchen  His weakness began affecting his gait about 10 days ago.  He has difficulty standing and performing his activities of daily living secondary to weakness.  He also reports muscle atrophy in bilateral lower extremities.  Recently he developed new neck pain as well as muscle spasms in both his neck and upper extremities.  He has been in [place] for the past 6 weeks and during that his symptoms have progressed.  He was hospitalized 5 days ago at a hospital in [place] for his symptoms where a CT scan was done of his head neck thoracic and lumbar spine.  Result per patient were negative.  He was then recommended to have an MRI but would not be able to have it completed timely fashion because he does have a spinal stimulator  is MR conditional so he decided to come home.  He arrived back in the [place] last night.  He denies any known sick contacts no unusual novel encounters or experiences no raw food consumption no tick or bug bites no new medications.  He does report that he drinks 8 glasses of wine per day and since his symptoms have been getting worse he has not been drinking as much as he usually does.  He smokes about 16 cigarettes/day and does not use any drugs.   Patient denies fevers/chills, dizziness, chest pain, shortness of breath, N/V/C/D, abdominal pain, dysuria/frequency, changes in mental status.  He also denies any difficulty with swallowing, incontinence, tremor.   Otherwise there has been no change in status. Patient has been taking medication as prescribed and there has been no recent change in medication or diet.  No recent antibiotics.  There has been no recent illness, hospitalizations, travel or sick contacts.    EMS/ED Course: Emergency department physician did speak with neurology who recommended additional MRI imaging and admission over at [facility] as well as extensive lab studies.  Medical admission has been requested for further management of weakness.  Review of Systems:  CONSTITUTIONAL: Positive weakness.  No fever/chills, fatigue, weight gain/loss, headache. EYES: No blurry or double vision. ENT: No tinnitus, postnasal drip, redness or soreness of the oropharynx. RESPIRATORY: No cough, dyspnea, wheeze.  No hemoptysis.  CARDIOVASCULAR: No chest pain, palpitations, syncope, orthopnea. No lower extremity edema.  GASTROINTESTINAL: Constipation.  No nausea, vomiting, abdominal pain, diarrhea  No hematemesis, melena or hematochezia. GENITOURINARY: No dysuria, frequency, hematuria. ENDOCRINE: No polyuria or nocturia. No heat or cold intolerance. HEMATOLOGY: No anemia, bruising, bleeding. INTEGUMENTARY: No rashes, ulcers, lesions. MUSCULOSKELETAL: Neck pain no arthritis, gout. NEUROLOGIC: Positive  numbness, tingling, ataxia. PSYCHIATRIC: No anxiety, depression, insomnia.   Past Medical History:  Diagnosis Date   Hypertension     History reviewed. No pertinent surgical history.   reports that he has been smoking cigarettes. He has never used smokeless tobacco. He reports current alcohol use. He reports that he does not use drugs.  Allergies  Allergen Reactions   Amlodipine Swelling   Ace Inhibitors Cough    History reviewed. No pertinent family history.  Prior to Admission medications   Medication Sig Start Date End Date Taking? Authorizing Provider  AMLODIPINE BESYLATE PO Take 10 mg by mouth.    [provider]  atenolol (TENORMIN) 50 MG tablet Take 50 mg by mouth daily.    [provider]  famotidine (PEPCID) 40 MG tablet Take 1 tablet (40 mg total) by mouth daily. 01/26/18   Petrucelli, Samantha R, PA-C  losartan (COZAAR) 50 MG tablet Take 50 mg by mouth daily.    [provider]  predniSONE (DELTASONE) 50 MG tablet Take 1 tablet (50 mg total) by mouth daily with breakfast. 01/26/18   Petrucelli, Samantha R, PA-C  simvastatin (ZOCOR) 40 MG tablet Take 40 mg by mouth daily.    [provider]    Physical Exam: Vitals:   03/19/23 1636 03/19/23 1639  BP: (!) 159/92 (!) 163/99  Pulse: 69 71  Resp: 18 16  Temp: (!) 97.5 F (36.4 C) 97.6 F (36.4 C)  TempSrc: Oral Oral  SpO2: 96% 94%    GENERAL: 72 y.o.-year-old male patient, well-developed, well-nourished lying in the bed in no acute distress.  Pleasant and cooperative.   HEENT: Head atraumatic, normocephalic. Pupils equal. Mucus membranes moist. NECK: Supple. No JVD. CHEST: Normal breath sounds bilaterally. No wheezing, rales, rhonchi or crackles. No use of accessory muscles of respiration.  No reproducible chest wall tenderness.  CARDIOVASCULAR: S1, S2 normal. No murmurs, rubs, or gallops. Cap refill <2 seconds. Pulses intact distally.  ABDOMEN: Soft, nondistended, nontender. No  rebound, guarding, rigidity. Normoactive bowel sounds present in all four quadrants.  EXTREMITIES: Mild pitting bilateral lower extremity edema to ankle.  No pedal edema, cyanosis, or clubbing. No calf tenderness or Homan's sign.  MUSCULOSKELETAL: Cervical spine paravertebral musculature is tender to palpation and somewhat limits range of motion. NEUROLOGIC: The patient is alert and oriented x 3. Cranial nerves II through XII are grossly intact with no focal sensorimotor deficit.  He does have bilateral upper extremity weakness which is symmetric.  He does have bilateral lower extremity weakness as well as atrophy of his quadricep muscles.  From a sensory perspective he has numbness in hands and feet bilaterally.  No cerebellar signs PSYCHIATRIC:  Normal affect, mood, thought content. SKIN: Warm, dry, and intact without obvious rash, lesion, or ulcer.    Labs on Admission:  CBC: Recent Labs  Lab 03/19/23 1825  WBC 6.4  NEUTROABS 4.0  HGB 13.0  HCT 38.0*  MCV 95.2  PLT 269   Basic Metabolic Panel: Recent Labs  Lab 03/19/23 1825  NA 135  K 3.6  CL 101  CO2 25  GLUCOSE 134*  BUN 12  CREATININE 0.79  CALCIUM 9.0  MG 2.0   GFR: CrCl cannot be  calculated (Unknown ideal weight.). Liver Function Tests: Recent Labs  Lab 03/19/23 1825  AST 22  ALT 15  ALKPHOS 73  BILITOT 0.6  PROT 6.8  ALBUMIN 3.8   No results for input(s): "LIPASE", "AMYLASE" in the last 168 hours. No results for input(s): "AMMONIA" in the last 168 hours. Coagulation Profile: No results for input(s): "INR", "PROTIME" in the last 168 hours. Cardiac Enzymes: Recent Labs  Lab 03/19/23 1825  CKTOTAL 194   BNP (last 3 results) No results for input(s): "PROBNP" in the last 8760 hours. HbA1C: No results for input(s): "HGBA1C" in the last 72 hours. CBG: No results for input(s): "GLUCAP" in the last 168 hours. Lipid Profile: No results for input(s): "CHOL", "HDL", "LDLCALC", "TRIG", "CHOLHDL",  "LDLDIRECT" in the last 72 hours. Thyroid Function Tests: No results for input(s): "TSH", "T4TOTAL", "FREET4", "T3FREE", "THYROIDAB" in the last 72 hours. Anemia Panel: No results for input(s): "VITAMINB12", "FOLATE", "FERRITIN", "TIBC", "IRON", "RETICCTPCT" in the last 72 hours. Urine analysis: No results found for: "COLORURINE", "APPEARANCEUR", "LABSPEC", "PHURINE", "GLUCOSEU", "HGBUR", "BILIRUBINUR", "KETONESUR", "PROTEINUR", "UROBILINOGEN", "NITRITE", "LEUKOCYTESUR" Sepsis Labs: @LABRCNTIP (procalcitonin:4,lacticidven:4) )No results found for this or any previous visit (from the past 240 hour(s)).   Radiological Exams on Admission: DG Chest 2 View  Result Date: 03/19/2023 CLINICAL DATA:  Generalized weakness. EXAM: CHEST - 2 VIEW COMPARISON:  August 20, 2016. FINDINGS: Stable cardiomediastinal silhouette. Both lungs are clear. The visualized skeletal structures are unremarkable. IMPRESSION: No active cardiopulmonary disease. Electronically Signed   By: Lupita Raider M.D.   On: 03/19/2023 18:24    EKG: Normal sinus rhythm at 67 bpm with right deviation, right atrial enlargement and nonspecific ST-T wave changes.   Assessment/Plan  This is a 72 y.o. male with a history of hypertension, alcohol abuse, low back pain status post spinal cord stimulator.  Now being admitted with:  #.  Progressive ascending weakness with a wide differential - Will admit to [facility] - Neurology has been consulted by the emergency department and recommended various lab testing which has all been ordered including vitamin levels, copper, zinc, HIV, RPR, TSH - MRI of the brain and C-spine have been ordered if they are not possible due to spinal cord stimulator would pursue CT myelogram. - Pain control - Fall precautions - Patient is requesting a catheter as he is having difficulty standing at bedside to use the commode  #.  History of hypertension -Continue diltiazem, olmesartan, aspirin  #.  History of  alcohol abuse -Monitor for withdrawal - CIWA protocol as needed -Vitamins  #. History of nicotineuse disorder - Continue nicotine patch - Cessation advised  #. History of BPH - Continue tamsulosin  #. History of hyperlipidemia - Continue simvastatin   Admission status: Inpatient IV Fluids: Hep-Lock Diet/Nutrition: Heart healthy Consults called: Neurology DVT Px: Lovenox, SCDs and early ambulation. Code Status: Full Code  Disposition Plan: To home in 1-2 days  All the records are reviewed and case discussed with ED provider. Management plans discussed with the patient and/or family who express understanding and agree with plan of care.    D.O. on 03/19/2023 at 7:19 PM CC: Primary care physician; Lupita Raider, MD   03/19/2023, 7:19 PM

## 2023-03-19 NOTE — ED Triage Notes (Signed)
Patient BIB EMS. For the past month patient has had neck and shoulder pain. He has progressively gotten more weak. Patient is now unable to walk and cannot lift his arms above his head.

## 2023-03-19 NOTE — ED Notes (Signed)
ED TO INPATIENT HANDOFF REPORT  ED Nurse Name and Phone #:  Lillia Abed [phone]  S Name/Age/Gender Sean Austin 72 y.o. male Room/Bed: WA11/WA11  Code Status   Code Status: Full Code  Home/SNF/Other Home Patient oriented to: self, place, time, and situation Is this baseline? Yes   Triage Complete: Triage complete  Chief Complaint Weakness [R53.1]  Triage Note Patient BIB EMS. For the past month patient has had neck and shoulder pain. He has progressively gotten more weak. Patient is now unable to walk and cannot lift his arms above his head.    Allergies Allergies  Allergen Reactions   Amlodipine Swelling   Ace Inhibitors Cough    Level of Care/Admitting Diagnosis ED Disposition     ED Disposition  Admit   Condition  --   Comment  Hospital Area: [facility] [[id number]]  Level of Care: Med-Surg [16]  May admit patient to [facility] or [facility] if equivalent level of care is available:: No  Covid Evaluation: Confirmed COVID Negative  Diagnosis: Weakness [241835]  Admitting Physician: Tonye Royalty [[id number]]  Attending Physician: Janann Colonel  Certification:: I certify this patient will need inpatient services for at least 2 midnights  Estimated Length of Stay: 2          B Medical/Surgery History Past Medical History:  Diagnosis Date   Hypertension    History reviewed. No pertinent surgical history.   A IV Location/Drains/Wounds Patient Lines/Drains/Airways Status     Active Line/Drains/Airways     Name Placement date Placement time Site Days   Peripheral IV 03/19/23 18 G Left;Posterior Forearm 03/19/23  2030  Forearm  less than 1   Peripheral IV 03/19/23 20 G Right Antecubital 03/19/23  1800  Antecubital  less than 1            Intake/Output Last 24 hours No intake or output data in the 24 hours ending 03/19/23 2126  Labs/Imaging Results for orders placed or performed during the hospital encounter  of 03/19/23 (from the past 48 hour(s))  CK     Status: None   Collection Time: 03/19/23  6:25 PM  Result Value Ref Range   Total CK 194 49 - 397 U/L    Comment: Performed at [facility], [address], [place], [place] [address]  CBC with Differential     Status: Abnormal   Collection Time: 03/19/23  6:25 PM  Result Value Ref Range   WBC 6.4 4.0 - 10.5 K/uL   RBC 3.99 (L) 4.22 - 5.81 MIL/uL   Hemoglobin 13.0 13.0 - 17.0 g/dL   HCT 82.9 (L) 56.2 - 13.0 %   MCV 95.2 80.0 - 100.0 fL   MCH 32.6 26.0 - 34.0 pg   MCHC 34.2 30.0 - 36.0 g/dL   RDW 86.5 (L) 78.4 - 69.6 %   Platelets 269 150 - 400 K/uL   nRBC 0.0 0.0 - 0.2 %   Neutrophils Relative % 61 %   Neutro Abs 4.0 1.7 - 7.7 K/uL   Lymphocytes Relative 27 %   Lymphs Abs 1.7 0.7 - 4.0 K/uL   Monocytes Relative 8 %   Monocytes Absolute 0.5 0.1 - 1.0 K/uL   Eosinophils Relative 3 %   Eosinophils Absolute 0.2 0.0 - 0.5 K/uL   Basophils Relative 1 %   Basophils Absolute 0.1 0.0 - 0.1 K/uL   Immature Granulocytes 0 %   Abs Immature Granulocytes 0.01 0.00 - 0.07 K/uL  Comment: Performed at [facility], [address] [place], [place] [address]  Comprehensive metabolic panel     Status: Abnormal   Collection Time: 03/19/23  6:25 PM  Result Value Ref Range   Sodium 135 135 - 145 mmol/L   Potassium 3.6 3.5 - 5.1 mmol/L   Chloride 101 98 - 111 mmol/L   CO2 25 22 - 32 mmol/L   Glucose, Bld 134 (H) 70 - 99 mg/dL    Comment: Glucose reference range applies only to samples taken after fasting for at least 8 hours.   BUN 12 8 - 23 mg/dL   Creatinine, Ser 9.62 0.61 - 1.24 mg/dL   Calcium 9.0 8.9 - 95.2 mg/dL   Total Protein 6.8 6.5 - 8.1 g/dL   Albumin 3.8 3.5 - 5.0 g/dL   AST 22 15 - 41 U/L   ALT 15 0 - 44 U/L   Alkaline Phosphatase 73 38 - 126 U/L   Total Bilirubin 0.6 0.3 - 1.2 mg/dL   GFR, Estimated >84 >13 mL/min    Comment: (NOTE) Calculated using the CKD-EPI Creatinine Equation  (2021)    Anion gap 9 5 - 15    Comment: Performed at [facility], [address] [place], [place] [address]  Magnesium     Status: None   Collection Time: 03/19/23  6:25 PM  Result Value Ref Range   Magnesium 2.0 1.7 - 2.4 mg/dL    Comment: Performed at [facility], [address], [place], [place] [address]  TSH     Status: Abnormal   Collection Time: 03/19/23  6:25 PM  Result Value Ref Range   TSH 8.735 (H) 0.350 - 4.500 uIU/mL    Comment: Performed by a 3rd Generation assay with a functional sensitivity of <=0.01 uIU/mL. Performed at [facility], [address], [place], [place] [address]   I-Stat CG4 Lactic Acid     Status: None   Collection Time: 03/19/23  6:44 PM  Result Value Ref Range   Lactic Acid, Venous 0.8 0.5 - 1.9 mmol/L  Urinalysis, Routine w reflex microscopic -Urine, Clean Catch     Status: Abnormal   Collection Time: 03/19/23  8:30 PM  Result Value Ref Range   Color, Urine YELLOW YELLOW   APPearance CLEAR CLEAR   Specific Gravity, Urine 1.014 1.005 - 1.030   pH 5.0 5.0 - 8.0   Glucose, UA NEGATIVE NEGATIVE mg/dL   Hgb urine dipstick NEGATIVE NEGATIVE   Bilirubin Urine NEGATIVE NEGATIVE   Ketones, ur 5 (A) NEGATIVE mg/dL   Protein, ur NEGATIVE NEGATIVE mg/dL   Nitrite NEGATIVE NEGATIVE   Leukocytes,Ua NEGATIVE NEGATIVE    Comment: Performed at [facility], [address], [place], [place] [address]  I-Stat CG4 Lactic Acid     Status: None   Collection Time: 03/19/23  8:41 PM  Result Value Ref Range   Lactic Acid, Venous 0.6 0.5 - 1.9 mmol/L   DG Chest 2 View  Result Date: 03/19/2023 CLINICAL DATA:  Generalized weakness. EXAM: CHEST - 2 VIEW COMPARISON:  August 20, 2016. FINDINGS: Stable cardiomediastinal silhouette. Both lungs are clear. The visualized skeletal structures are unremarkable. IMPRESSION: No active cardiopulmonary disease. Electronically Signed   By: Lupita Raider  M.D.   On: 03/19/2023 18:24    Pending Labs Unresulted Labs (From admission, onward)     Start     Ordered   03/19/23 2016  Sedimentation rate  Add-on,   AD        03/19/23 2016   03/19/23 2016  C-reactive protein  Add-on,   AD        03/19/23 2016   03/19/23 1902  Vitamin B12  Once,   URGENT        03/19/23 1908   03/19/23 1902  Folate, serum, performed at [facility] lab  Once,   URGENT        03/19/23 1908   03/19/23 1902  Rapid HIV screen (HIV 1/2 Ab+Ag)  Once,   URGENT        03/19/23 1908   03/19/23 1902  Zinc  Once,   URGENT        03/19/23 1908   03/19/23 1902  Copper, serum  Once,   URGENT        03/19/23 1908   03/19/23 1902  Ceruloplasmin  Once,   URGENT        03/19/23 1908   03/19/23 1902  RPR  Once,   URGENT        03/19/23 1908   Signed and Held  Comprehensive metabolic panel  Tomorrow morning,   R        Signed and Held   Signed and Held  CBC  Tomorrow morning,   R        Signed and Held   Signed and Held  CBC  (heparin)  Once,   R       Comments: Baseline for heparin therapy IF NOT ALREADY DRAWN.  Notify MD if PLT < 100 K.    Signed and Held   Signed and Held  Creatinine, serum  (heparin)  Once,   R       Comments: Baseline for heparin therapy IF NOT ALREADY DRAWN.    Signed and Held            Vitals/Pain Today's Vitals   03/19/23 2030 03/19/23 2045 03/19/23 2100 03/19/23 2115  BP: (!) 157/95 (!) 149/88 (!) 148/85 (!) 148/84  Pulse: 87 71 60 60  Resp: 19 14 15 12   Temp:      TempSrc:      SpO2: 100% 95% 95% 93%    Isolation Precautions No active isolations  Medications Medications  aspirin chewable tablet 81 mg (has no administration in time range)  diltiazem (DILACOR XR) 24 hr capsule 180 mg (has no administration in time range)  folic acid (FOLVITE) tablet 400 mcg (has no administration in time range)  irbesartan (AVAPRO) tablet 300 mg (has no administration in time range)  simvastatin (ZOCOR) tablet 40 mg (has no administration in  time range)  tamsulosin (FLOMAX) capsule 0.8 mg (has no administration in time range)  LORazepam (ATIVAN) tablet 1-4 mg (has no administration in time range)    Or  LORazepam (ATIVAN) injection 1-4 mg (has no administration in time range)  thiamine (VITAMIN B1) tablet 100 mg (has no administration in time range)    Or  thiamine (VITAMIN B1) injection 100 mg (has no administration in time range)  folic acid (FOLVITE) tablet 1 mg (has no administration in time range)  multivitamin with minerals tablet 1 tablet (has no administration in time range)    Mobility walks     Focused Assessments     R Recommendations: See Admitting Provider Note  Report given to:   Additional Notes:  Pt ambulatory at baseline, due to condition, he is not independently ambulatory at this time.

## 2023-03-19 NOTE — ED Provider Notes (Signed)
[place] [facility]ERGENCY DEPARTMENT AT [facility] Provider Note   CSN: [id number] Arrival date & time: 03/19/23  1630     History  Chief Complaint  Patient presents with   Weakness    Sean Austin is a 72 y.o. male.  The history is provided by the patient, medical records and the spouse. No language interpreter was used.  Weakness Severity:  Severe Onset quality:  Gradual Duration:  6 weeks (worsening) Timing:  Constant Progression:  Partially resolved Chronicity:  New Context: not recent infection   Relieved by:  Nothing Worsened by:  Nothing Associated symptoms: diarrhea, extremity numbness, myalgias (mild soreness in upper legs at times) and sensory-motor deficit   Associated symptoms: no abdominal pain, no aphasia, no chest pain, no cough, no dysuria, no falls, no fever, no foul-smelling urine, no frequency, no headaches, no lethargy, no loss of consciousness, no nausea, no near-syncope, no shortness of breath, no stroke symptoms, no vision change and no vomiting        Home Medications Prior to Admission medications   Medication Sig Start Date End Date Taking? Authorizing Provider  AMLODIPINE BESYLATE PO Take 10 mg by mouth.    [provider]  atenolol (TENORMIN) 50 MG tablet Take 50 mg by mouth daily.    [provider]  famotidine (PEPCID) 40 MG tablet Take 1 tablet (40 mg total) by mouth daily. 01/26/18   Petrucelli, Samantha R, PA-C  losartan (COZAAR) 50 MG tablet Take 50 mg by mouth daily.    [provider]  predniSONE (DELTASONE) 50 MG tablet Take 1 tablet (50 mg total) by mouth daily with breakfast. 01/26/18   Petrucelli, Samantha R, PA-C  simvastatin (ZOCOR) 40 MG tablet Take 40 mg by mouth daily.    [provider]      Allergies    Amlodipine and Ace inhibitors    Review of Systems   Review of Systems  Constitutional:  Negative for chills, diaphoresis, fatigue and fever.  HENT:  Negative for congestion.    Eyes:  Negative for visual disturbance.  Respiratory:  Negative for cough, chest tightness, shortness of breath and wheezing.   Cardiovascular:  Negative for chest pain, leg swelling and near-syncope.  Gastrointestinal:  Positive for constipation and diarrhea. Negative for abdominal pain, nausea and vomiting.  Genitourinary:  Negative for dysuria, flank pain and frequency.  Musculoskeletal:  Positive for myalgias (mild soreness in upper legs at times) and neck pain. Negative for back pain, falls and neck stiffness.  Skin:  Negative for rash and wound.  Neurological:  Positive for weakness and numbness. Negative for loss of consciousness, facial asymmetry, speech difficulty, light-headedness and headaches.  Psychiatric/Behavioral:  Negative for agitation and confusion.   All other systems reviewed and are negative.   Physical Exam Updated Vital Signs BP (!) 163/99   Pulse 71   Temp 97.6 F (36.4 C) (Oral)   Resp 16   SpO2 94%  Physical Exam Vitals and nursing note reviewed.  Constitutional:      General: He is not in acute distress.    Appearance: He is well-developed. He is not ill-appearing, toxic-appearing or diaphoretic.  HENT:     Head: Normocephalic and atraumatic.     Nose: No congestion or rhinorrhea.     Mouth/Throat:     Mouth: Mucous membranes are moist.     Pharynx: No oropharyngeal exudate or posterior oropharyngeal erythema.  Eyes:     Extraocular Movements: Extraocular movements intact.  Conjunctiva/sclera: Conjunctivae normal.     Pupils: Pupils are equal, round, and reactive to light.  Neck:     Vascular: No carotid bruit.  Cardiovascular:     Rate and Rhythm: Normal rate and regular rhythm.     Heart sounds: No murmur heard. Pulmonary:     Effort: Pulmonary effort is normal. No respiratory distress.     Breath sounds: Normal breath sounds. No wheezing, rhonchi or rales.  Chest:     Chest wall: No tenderness.  Abdominal:     General: Abdomen is flat.      Palpations: Abdomen is soft.     Tenderness: There is no abdominal tenderness. There is no right CVA tenderness, left CVA tenderness, guarding or rebound.  Musculoskeletal:        General: Tenderness present. No swelling.     Cervical back: Neck supple. Tenderness present.     Right lower leg: No edema.     Left lower leg: No edema.  Skin:    General: Skin is warm and dry.     Capillary Refill: Capillary refill takes less than 2 seconds.     Findings: No erythema.  Neurological:     Mental Status: He is alert.     Cranial Nerves: No cranial nerve deficit, dysarthria or facial asymmetry.     Sensory: Sensory deficit present.     Motor: Weakness present.     Coordination: Finger-Nose-Finger Test normal.     Deep Tendon Reflexes: Reflexes abnormal.     Comments: Patient has decreased reflexes in patella bilaterally.  He has positive Hoffmann sign and right hand but not left.  Patient has weakness in grip and legs bilaterally.  He has numbness in hands and feet bilaterally.  Intact finger-nose-finger testing initially.  Intact pulses.  Psychiatric:        Mood and Affect: Mood normal.          ED Results / Procedures / Treatments   Labs (all labs ordered are listed, but only abnormal results are displayed) Labs Reviewed  CBC WITH DIFFERENTIAL/PLATELET - Abnormal; Notable for the following components:      Result Value   RBC 3.99 (*)    HCT 38.0 (*)    RDW 11.4 (*)    All other components within normal limits  COMPREHENSIVE METABOLIC PANEL - Abnormal; Notable for the following components:   Glucose, Bld 134 (*)    All other components within normal limits  TSH - Abnormal; Notable for the following components:   TSH 8.735 (*)    All other components within normal limits  URINALYSIS, ROUTINE W REFLEX MICROSCOPIC - Abnormal; Notable for the following components:   Ketones, ur 5 (*)    All other components within normal limits  SEDIMENTATION RATE - Abnormal; Notable for  the following components:   Sed Rate 29 (*)    All other components within normal limits  CK  MAGNESIUM  RAPID HIV SCREEN (HIV 1/2 AB+AG)  VITAMIN B12  FOLATE  CERULOPLASMIN  RPR  C-REACTIVE PROTEIN  COPPER, SERUM  ZINC  COMPREHENSIVE METABOLIC PANEL  CBC  CBC  CREATININE, SERUM  I-STAT CG4 LACTIC ACID, ED  I-STAT CG4 LACTIC ACID, ED    EKG EKG Interpretation Date/Time:  Tuesday March 19 2023 16:49:48 EDT Ventricular Rate:  67 PR Interval:  161 QRS Duration:  111 QT Interval:  439 QTC Calculation: 464 R Axis:   85  Text Interpretation: Sinus rhythm Right atrial enlargement Borderline right axis deviation  Artifact in lead(s) I II III aVR aVL aVF Confirmed by Linwood Dibbles [phone]) on 03/19/2023 4:54:49 PM  Radiology DG Chest 2 View  Result Date: 03/19/2023 CLINICAL DATA:  Generalized weakness. EXAM: CHEST - 2 VIEW COMPARISON:  August 20, 2016. FINDINGS: Stable cardiomediastinal silhouette. Both lungs are clear. The visualized skeletal structures are unremarkable. IMPRESSION: No active cardiopulmonary disease. Electronically Signed   By: Lupita Raider M.D.   On: 03/19/2023 18:24    Procedures Procedures    Medications Ordered in ED Medications  sodium chloride flush (NS) 0.9 % injection 3 mL (has no administration in time range)  acetaminophen (TYLENOL) tablet 650 mg (has no administration in time range)    Or  acetaminophen (TYLENOL) suppository 650 mg (has no administration in time range)  HYDROcodone-acetaminophen (NORCO/VICODIN) 5-325 MG per tablet 1-2 tablet (has no administration in time range)  morphine (PF) 2 MG/ML injection 1 mg (has no administration in time range)  traZODone (DESYREL) tablet 25 mg (has no administration in time range)  senna-docusate (Senokot-S) tablet 1 tablet (has no administration in time range)  bisacodyl (DULCOLAX) EC tablet 5 mg (has no administration in time range)  ondansetron (ZOFRAN) tablet 4 mg (has no administration in time range)     Or  ondansetron (ZOFRAN) injection 4 mg (has no administration in time range)  hydrALAZINE (APRESOLINE) injection 5 mg (has no administration in time range)  heparin injection 5,000 Units (has no administration in time range)  aspirin chewable tablet 81 mg (has no administration in time range)  irbesartan (AVAPRO) tablet 300 mg (has no administration in time range)  tamsulosin (FLOMAX) capsule 0.8 mg (has no administration in time range)  LORazepam (ATIVAN) tablet 1-4 mg (has no administration in time range)    Or  LORazepam (ATIVAN) injection 1-4 mg (has no administration in time range)  thiamine (VITAMIN B1) tablet 100 mg (has no administration in time range)    Or  thiamine (VITAMIN B1) injection 100 mg (has no administration in time range)  folic acid (FOLVITE) tablet 1 mg (has no administration in time range)  multivitamin with minerals tablet 1 tablet (has no administration in time range)  atorvastatin (LIPITOR) tablet 20 mg (has no administration in time range)  diltiazem (CARDIZEM CD) 24 hr capsule 180 mg (has no administration in time range)    ED Course/ Medical Decision Making/ A&P                                 Medical Decision Making Amount and/or Complexity of Data Reviewed Labs: ordered. Radiology: ordered.  Risk Decision regarding hospitalization.    Avidan Cleveringa is a 72 y.o. male with a past medical history significant for hypertension and previous low back pains with [employer] spinal stimulator placed since 2022 who presents from PCP with progressive neurologic deficits.  According to patient, for the last 6 weeks, patient has had worsened numbness and weakness in all extremities as well as pain in his neck going to his shoulders bilaterally.  He reports that at first it was some difficulty with his gait as he was over in [place] but then about 10 days ago it worsened acutely.  He reportedly had a workup in skull including CT of the head, neck, and  spine that did not show any acute abnormalities and had lab workup as well.  He has a Best boy that is reportedly MRI conditional but  they did not feel comfortable doing MRI at the time.  Patient went to his PCP today who requested patient come to the hospital for further evaluation and management due to the progressive nature.  According to patient, he has severe numbness in both hands and both feet and has worsening weakness in extremities.  He is right-handed.  He reports that the right side does seem worse than the left side but they are very similar.  He now can barely lift his arms and cannot safely get up and ambulate.  He reports no previous neck injury but reports his neck pain is been constant.  Denies any previous neck manipulation or neck surgeries and denies any fevers or chills.  He reports he has had some mild waxing waning diarrhea and constipation that does not seem to be related to his symptoms.  Denies any urinary changes.  Denies any pain in extremities other than fasciculations he has noticed and some soreness in his thighs.  Denies history of ALS, MS, strokes, vitamin deficiencies, tick exposures, or eating food out of suspicious canned goods.  No history of Guillain-Barr or any family history of any of these conditions.  Patient reports no difficulty breathing or swallowing and reports no vision changes.  He reports no worsened pain in his mid or low back..  No speech difficulties, vision changes, and no headaches.  On my exam, lungs were clear.  No murmur.  Chest not abdomen nontender.  No tenderness in the neck or back.  He had normal range of motion of his neck.  He had difficulty raising his legs although they seem symmetrically weak.  He had distal pulses in extremities.  He had subjective weakness in grip strength bilaterally.  He did have a positive Hoffmann sign on the right hand but negative on the left.  He did seem to have diminished reflexes in both  patellar reflexes.  No rash seen.  He has numbness in all 4 extremities compared to his baseline and he had diminished vibration sense distally in all extremities.  He could feel vibration in his shoulder and elbow but could not in his hands when going from hand to the more proximal joints.  Same with his legs, he could not feel a vibratory sense in his right ankle but then could in his knee.  Could not do it on the left side either.  Clinically I am concerned about a problem in his neck given the new neck pain and soreness with extremity problems.  I am concerned about the progression especially over the last week and agree that he will likely admission for neurologic evaluation and management.  Will discuss with neurology what workup they feel he may need but will start with some screening labs.  Will discuss what vitamin deficiencies they may want [place] to evaluate for.  Will get a CK with concern for some possible polymyositis given the fasciculations and some mild muscle soreness.  The timeline seems less likely to be a Guillain-Barr type picture and he has no difficulty with his breathing or swallowing.  Ultimately I think MRI will be needed.  Imaging obtained of the MRI card see above.   Other etiologies considered including including some central cord problem, ALS, vitamin deficiency, tumor, or stroke.  He denied any suspicious food or fish or any tick exposures.  Anticipate admission for further neurologic workup.  7:08 PM Spoke to neurology who recommended various other tests including vitamin levels, copper, zinc, HIV, RPR, and felt  he does need MRI brain and C-spine with and without contrast.  He feels the patient needs admission to [facility] to have neurology involved and for the MRIs that likely to be done there.  If MRI is unable to happen due to the spinal stimulator, he would need a CT myelogram.  Neurology did not recommend LP at this time but we discussed with patient that he may  need this at some point during his admission.  Will place the orders and will call medicine for admission to [facility].         Final Clinical Impression(s) / ED Diagnoses Final diagnoses:  Weakness of distal arms and legs  Bilateral numbness and tingling of arms and legs  Neck pain     Clinical Impression: 1. Weakness of distal arms and legs   2. Bilateral numbness and tingling of arms and legs   3. Neck pain     Disposition: Admit  This note was prepared with assistance of Dragon voice recognition software. Occasional wrong-word or sound-a-like substitutions may have occurred due to the inherent limitations of voice recognition software.     , Canary Brim, MD 03/19/23 2258

## 2023-03-20 ENCOUNTER — Inpatient Hospital Stay (HOSPITAL_COMMUNITY): Payer: Medicare Other

## 2023-03-20 ENCOUNTER — Inpatient Hospital Stay (HOSPITAL_COMMUNITY)
Admission: EM | Disposition: A | Discharge status: Home Health | Payer: Self-pay | Source: Home / Self Care | Attending: Internal Medicine

## 2023-03-20 ENCOUNTER — Other Ambulatory Visit: Payer: Self-pay

## 2023-03-20 ENCOUNTER — Inpatient Hospital Stay (HOSPITAL_COMMUNITY): Payer: Medicare Other | Admitting: Anesthesiology

## 2023-03-20 ENCOUNTER — Encounter (HOSPITAL_COMMUNITY): Payer: Self-pay | Admitting: Family Medicine

## 2023-03-20 DIAGNOSIS — R200 Anesthesia of skin: Secondary | ICD-10-CM | POA: Diagnosis not present

## 2023-03-20 DIAGNOSIS — M4712 Other spondylosis with myelopathy, cervical region: Secondary | ICD-10-CM | POA: Diagnosis not present

## 2023-03-20 DIAGNOSIS — R531 Weakness: Secondary | ICD-10-CM | POA: Diagnosis not present

## 2023-03-20 DIAGNOSIS — R202 Paresthesia of skin: Secondary | ICD-10-CM | POA: Diagnosis not present

## 2023-03-20 DIAGNOSIS — R29898 Other symptoms and signs involving the musculoskeletal system: Secondary | ICD-10-CM | POA: Diagnosis not present

## 2023-03-20 HISTORY — PX: ANTERIOR CERVICAL DECOMP/DISCECTOMY FUSION: SHX1161

## 2023-03-20 LAB — T4, FREE: Free T4: 0.83 ng/dL (ref 0.61–1.12)

## 2023-03-20 SURGERY — ANTERIOR CERVICAL DECOMPRESSION/DISCECTOMY FUSION 2 LEVELS
Anesthesia: General

## 2023-03-20 MED ORDER — LIDOCAINE 2% (20 MG/ML) 5 ML SYRINGE
INTRAMUSCULAR | Status: AC
  Filled 2023-03-20: qty 5

## 2023-03-20 MED ORDER — CEFAZOLIN SODIUM-DEXTROSE 2-3 GM-%(50ML) IV SOLR
INTRAVENOUS | Status: DC | PRN
Start: 2023-03-20 — End: 2023-03-20
  Administered 2023-03-20: 2 g via INTRAVENOUS

## 2023-03-20 MED ORDER — MIDAZOLAM HCL 2 MG/2ML IJ SOLN
INTRAMUSCULAR | Status: DC | PRN
  Administered 2023-03-20: 1 mg via INTRAVENOUS

## 2023-03-20 MED ORDER — CHLORHEXIDINE GLUCONATE 0.12 % MT SOLN
OROMUCOSAL | Status: AC
  Administered 2023-03-20: 15 mL via OROMUCOSAL
  Filled 2023-03-20: qty 15

## 2023-03-20 MED ORDER — FENTANYL CITRATE (PF) 250 MCG/5ML IJ SOLN
INTRAMUSCULAR | Status: DC | PRN
  Administered 2023-03-20 (×3): 50 ug via INTRAVENOUS

## 2023-03-20 MED ORDER — ACETAMINOPHEN 500 MG PO TABS
1000.0000 mg | ORAL_TABLET | Freq: Once | ORAL | Status: AC
  Administered 2023-03-20: 1000 mg via ORAL

## 2023-03-20 MED ORDER — DEXAMETHASONE SODIUM PHOSPHATE 10 MG/ML IJ SOLN
10.0000 mg | Freq: Four times a day (QID) | INTRAMUSCULAR | Status: DC
  Administered 2023-03-20 – 2023-03-21 (×4): 10 mg via INTRAVENOUS
  Filled 2023-03-20 (×3): qty 1

## 2023-03-20 MED ORDER — LIDOCAINE 2% (20 MG/ML) 5 ML SYRINGE
INTRAMUSCULAR | Status: DC | PRN
  Administered 2023-03-20: 60 mg via INTRAVENOUS

## 2023-03-20 MED ORDER — PHENYLEPHRINE 80 MCG/ML (10ML) SYRINGE FOR IV PUSH (FOR BLOOD PRESSURE SUPPORT)
PREFILLED_SYRINGE | INTRAVENOUS | Status: DC | PRN
  Administered 2023-03-20 (×2): 160 ug via INTRAVENOUS
  Administered 2023-03-20 (×2): 80 ug via INTRAVENOUS

## 2023-03-20 MED ORDER — THROMBIN 5000 UNITS EX SOLR
OROMUCOSAL | Status: DC | PRN
  Administered 2023-03-20: 5 mL via TOPICAL

## 2023-03-20 MED ORDER — CEFAZOLIN SODIUM 1 G IJ SOLR
INTRAMUSCULAR | Status: AC
  Filled 2023-03-20: qty 20

## 2023-03-20 MED ORDER — OXYCODONE HCL 5 MG/5ML PO SOLN: 5.0000 mg | Freq: Once | ORAL | Status: DC | PRN

## 2023-03-20 MED ORDER — PHENYLEPHRINE HCL-NACL 20-0.9 MG/250ML-% IV SOLN
INTRAVENOUS | Status: DC | PRN
  Administered 2023-03-20: 30 ug/min via INTRAVENOUS

## 2023-03-20 MED ORDER — THROMBIN (RECOMBINANT) 5000 UNITS EX SOLR
CUTANEOUS | Status: DC | PRN
  Administered 2023-03-20: 10 mL via TOPICAL

## 2023-03-20 MED ORDER — PROPOFOL 10 MG/ML IV BOLUS
INTRAVENOUS | Status: DC | PRN
Start: 2023-03-20 — End: 2023-03-20
  Administered 2023-03-20: 170 mg via INTRAVENOUS

## 2023-03-20 MED ORDER — MIDAZOLAM HCL 2 MG/2ML IJ SOLN
INTRAMUSCULAR | Status: AC
  Filled 2023-03-20: qty 2

## 2023-03-20 MED ORDER — LACTATED RINGERS IV SOLN: INTRAVENOUS | Status: DC

## 2023-03-20 MED ORDER — SUCCINYLCHOLINE CHLORIDE 200 MG/10ML IV SOSY
PREFILLED_SYRINGE | INTRAVENOUS | Status: DC | PRN
  Administered 2023-03-20: 120 mg via INTRAVENOUS

## 2023-03-20 MED ORDER — ONDANSETRON HCL 4 MG/2ML IJ SOLN
INTRAMUSCULAR | Status: AC
  Filled 2023-03-20: qty 2

## 2023-03-20 MED ORDER — OXYCODONE HCL 5 MG PO TABS: 5.0000 mg | ORAL_TABLET | Freq: Once | ORAL | Status: DC | PRN

## 2023-03-20 MED ORDER — ONDANSETRON HCL 4 MG/2ML IJ SOLN
INTRAMUSCULAR | Status: DC | PRN
  Administered 2023-03-20: 4 mg via INTRAVENOUS

## 2023-03-20 MED ORDER — ACETAMINOPHEN 500 MG PO TABS
ORAL_TABLET | ORAL | Status: AC
  Filled 2023-03-20: qty 2

## 2023-03-20 MED ORDER — PHENYLEPHRINE 80 MCG/ML (10ML) SYRINGE FOR IV PUSH (FOR BLOOD PRESSURE SUPPORT)
PREFILLED_SYRINGE | INTRAVENOUS | Status: AC
  Filled 2023-03-20: qty 10

## 2023-03-20 MED ORDER — PANTOPRAZOLE SODIUM 40 MG IV SOLR
40.0000 mg | Freq: Two times a day (BID) | INTRAVENOUS | Status: DC
  Administered 2023-03-20 – 2023-03-22 (×5): 40 mg via INTRAVENOUS
  Filled 2023-03-20 (×5): qty 10

## 2023-03-20 MED ORDER — DEXAMETHASONE SODIUM PHOSPHATE 10 MG/ML IJ SOLN
INTRAMUSCULAR | Status: AC
  Filled 2023-03-20: qty 1

## 2023-03-20 MED ORDER — POTASSIUM CHLORIDE CRYS ER 20 MEQ PO TBCR
40.0000 meq | EXTENDED_RELEASE_TABLET | Freq: Once | ORAL | Status: AC
  Administered 2023-03-20: 40 meq via ORAL
  Filled 2023-03-20: qty 2

## 2023-03-20 MED ORDER — CHLORHEXIDINE GLUCONATE 0.12 % MT SOLN: 15.0000 mL | Freq: Once | OROMUCOSAL | Status: AC

## 2023-03-20 MED ORDER — THROMBIN 5000 UNITS EX SOLR
CUTANEOUS | Status: AC
  Filled 2023-03-20: qty 15000

## 2023-03-20 MED ORDER — EPHEDRINE 5 MG/ML INJ
INTRAVENOUS | Status: AC
  Filled 2023-03-20: qty 5

## 2023-03-20 MED ORDER — FENTANYL CITRATE (PF) 250 MCG/5ML IJ SOLN
INTRAMUSCULAR | Status: AC
  Filled 2023-03-20: qty 5

## 2023-03-20 MED ORDER — ONDANSETRON HCL 4 MG/2ML IJ SOLN: 4.0000 mg | Freq: Once | INTRAMUSCULAR | Status: DC | PRN

## 2023-03-20 MED ORDER — FENTANYL CITRATE (PF) 100 MCG/2ML IJ SOLN: 25.0000 ug | INTRAMUSCULAR | Status: DC | PRN

## 2023-03-20 MED ORDER — ROCURONIUM BROMIDE 10 MG/ML (PF) SYRINGE
PREFILLED_SYRINGE | INTRAVENOUS | Status: DC | PRN
  Administered 2023-03-20: 10 mg via INTRAVENOUS
  Administered 2023-03-20 (×2): 20 mg via INTRAVENOUS
  Administered 2023-03-20: 50 mg via INTRAVENOUS

## 2023-03-20 MED ORDER — 0.9 % SODIUM CHLORIDE (POUR BTL) OPTIME
TOPICAL | Status: DC | PRN
  Administered 2023-03-20: 1000 mL

## 2023-03-20 MED ORDER — ROCURONIUM BROMIDE 10 MG/ML (PF) SYRINGE
PREFILLED_SYRINGE | INTRAVENOUS | Status: AC
  Filled 2023-03-20: qty 10

## 2023-03-20 MED ORDER — ORAL CARE MOUTH RINSE: 15.0000 mL | Freq: Once | OROMUCOSAL | Status: AC

## 2023-03-20 MED ORDER — PROPOFOL 10 MG/ML IV BOLUS
INTRAVENOUS | Status: AC
  Filled 2023-03-20: qty 20

## 2023-03-20 MED ORDER — SUGAMMADEX SODIUM 200 MG/2ML IV SOLN
INTRAVENOUS | Status: DC | PRN
  Administered 2023-03-20: 200 mg via INTRAVENOUS

## 2023-03-20 SURGICAL SUPPLY — 57 items
ADH SKN CLS APL DERMABOND .7 (GAUZE/BANDAGES/DRESSINGS)
ADH SKN CLS LQ APL DERMABOND (GAUZE/BANDAGES/DRESSINGS) ×1
APL SKNCLS STERI-STRIP NONHPOA (GAUZE/BANDAGES/DRESSINGS) ×1
BAG COUNTER SPONGE SURGICOUNT (BAG) ×1 IMPLANT
BAG SPNG CNTER NS LX DISP (BAG) ×1
BASKET BONE COLLECTION (BASKET) ×1 IMPLANT
BENZOIN TINCTURE PRP APPL 2/3 (GAUZE/BANDAGES/DRESSINGS) ×1 IMPLANT
BIT DRILL NEURO 2X3.1 SFT TUCH (MISCELLANEOUS) ×1 IMPLANT
BONE CC-ACS 11X14X7 6D (Bone Implant) ×2 IMPLANT
BUR MATCHSTICK NEURO 3.0 LAGG (BURR) ×1 IMPLANT
CANISTER SUCT 3000ML PPV (MISCELLANEOUS) ×1 IMPLANT
CHIPS BONE CANC-ACS11X14X7 6D (Bone Implant) IMPLANT
DERMABOND ADVANCED .7 DNX12 (GAUZE/BANDAGES/DRESSINGS) IMPLANT
DERMABOND ADVANCED .7 DNX6 (GAUZE/BANDAGES/DRESSINGS) IMPLANT
DRAPE C-ARM 42X72 X-RAY (DRAPES) ×2 IMPLANT
DRAPE LAPAROTOMY 100X72 PEDS (DRAPES) ×1 IMPLANT
DRAPE MICROSCOPE SLANT 54X150 (MISCELLANEOUS) ×1 IMPLANT
DRILL NEURO 2X3.1 SOFT TOUCH (MISCELLANEOUS) ×1
DRSG OPSITE POSTOP 4X6 (GAUZE/BANDAGES/DRESSINGS) IMPLANT
DURAPREP 6ML APPLICATOR 50/CS (WOUND CARE) ×1 IMPLANT
ELECT COATED BLADE 2.86 ST (ELECTRODE) ×1 IMPLANT
ELECT REM PT RETURN 9FT ADLT (ELECTROSURGICAL) ×1
ELECTRODE REM PT RTRN 9FT ADLT (ELECTROSURGICAL) ×1 IMPLANT
GAUZE 4X4 16PLY ~~LOC~~+RFID DBL (SPONGE) IMPLANT
GAUZE SPONGE 4X4 12PLY STRL (GAUZE/BANDAGES/DRESSINGS) ×1 IMPLANT
GLOVE BIO SURGEON STRL SZ7 (GLOVE) IMPLANT
GLOVE BIO SURGEON STRL SZ8 (GLOVE) ×1 IMPLANT
GLOVE BIOGEL PI IND STRL 7.0 (GLOVE) IMPLANT
GLOVE EXAM NITRILE XL STR (GLOVE) IMPLANT
GLOVE INDICATOR 8.5 STRL (GLOVE) ×2 IMPLANT
GOWN STRL REUS W/ TWL LRG LVL3 (GOWN DISPOSABLE) IMPLANT
GOWN STRL REUS W/ TWL XL LVL3 (GOWN DISPOSABLE) ×1 IMPLANT
GOWN STRL REUS W/TWL 2XL LVL3 (GOWN DISPOSABLE) IMPLANT
GOWN STRL REUS W/TWL LRG LVL3 (GOWN DISPOSABLE)
GOWN STRL REUS W/TWL XL LVL3 (GOWN DISPOSABLE) ×1
HALTER HD/CHIN CERV TRACTION D (MISCELLANEOUS) ×1 IMPLANT
HEMOSTAT POWDER KIT SURGIFOAM (HEMOSTASIS) ×1 IMPLANT
KIT BASIN OR (CUSTOM PROCEDURE TRAY) ×1 IMPLANT
KIT TURNOVER KIT B (KITS) ×1 IMPLANT
NDL SPNL 20GX3.5 QUINCKE YW (NEEDLE) ×1 IMPLANT
NEEDLE SPNL 20GX3.5 QUINCKE YW (NEEDLE) ×1 IMPLANT
NS IRRIG 1000ML POUR BTL (IV SOLUTION) ×1 IMPLANT
PACK LAMINECTOMY NEURO (CUSTOM PROCEDURE TRAY) ×1 IMPLANT
PAD ARMBOARD 7.5X6 YLW CONV (MISCELLANEOUS) ×3 IMPLANT
PIN DISTRACTION 14MM (PIN) IMPLANT
PLATE CERV RES 34 2L (Plate) IMPLANT
SCREW VA SD RESONATE 4.2X14 (Screw) IMPLANT
SPONGE INTESTINAL PEANUT (DISPOSABLE) ×1 IMPLANT
SPONGE SURGIFOAM ABS GEL SZ50 (HEMOSTASIS) ×1 IMPLANT
STRIP CLOSURE SKIN 1/2X4 (GAUZE/BANDAGES/DRESSINGS) ×1 IMPLANT
SUT SILK 2 0 TIES 10X30 (SUTURE) IMPLANT
SUT VIC AB 3-0 SH 8-18 (SUTURE) ×1 IMPLANT
SUT VICRYL 4-0 PS2 18IN ABS (SUTURE) ×1 IMPLANT
TAPE CLOTH 4X10 WHT NS (GAUZE/BANDAGES/DRESSINGS) ×1 IMPLANT
TOWEL GREEN STERILE (TOWEL DISPOSABLE) ×1 IMPLANT
TOWEL GREEN STERILE FF (TOWEL DISPOSABLE) ×1 IMPLANT
WATER STERILE IRR 1000ML POUR (IV SOLUTION) ×1 IMPLANT

## 2023-03-20 NOTE — Plan of Care (Signed)

## 2023-03-20 NOTE — Anesthesia Postprocedure Evaluation (Signed)
Anesthesia Post Note  Patient: Sean Austin  Procedure(s) Performed: ANTERIOR CERVICAL DECOMPRESSION/DISCECTOMY FUSION CERVICAL FOUR-FIVE, CERVICAL FIVE-SIX     Patient location during evaluation: PACU Anesthesia Type: General Level of consciousness: oriented, patient cooperative and sedated Pain management: pain level controlled Vital Signs Assessment: post-procedure vital signs reviewed and stable Respiratory status: spontaneous breathing, nonlabored ventilation and respiratory function stable Cardiovascular status: blood pressure returned to baseline and stable Postop Assessment: no apparent nausea or vomiting Anesthetic complications: no   No notable events documented.  Last Vitals:  Vitals:   03/20/23 2000 03/20/23 2015  BP: 129/66 125/66  Pulse: 82 76  Resp: (!) 7 17  Temp:    SpO2: 94% 93%    Last Pain:  Vitals:   03/20/23 2015  TempSrc:   PainSc: Asleep    LLE Motor Response: Purposeful movement;Responds to commands (03/20/23 2015) LLE Sensation: Full sensation (03/20/23 2015) RLE Motor Response: Purposeful movement;Responds to commands (03/20/23 2015) RLE Sensation: Full sensation (03/20/23 2015)      Erling Cruz. 

## 2023-03-20 NOTE — Anesthesia Preprocedure Evaluation (Addendum)
Anesthesia Evaluation  Patient identified by MRN, date of birth, ID band Patient awake    Reviewed: Allergy & Precautions, NPO status , Patient's Chart, lab work & pertinent test results  History of Anesthesia Complications Negative for: history of anesthetic complications  Airway Mallampati: II  TM Distance: >3 FB     Dental  (+) Dental Advisory Given, Poor Dentition, Chipped   Pulmonary Current Smoker and Patient abstained from smoking.   Pulmonary exam normal        Cardiovascular hypertension, Pt. on medications Normal cardiovascular exam     Neuro/Psych  negative psych ROS   GI/Hepatic negative GI ROS, Neg liver ROS,,,  Endo/Other  negative endocrine ROS    Renal/GU negative Renal ROS     Musculoskeletal negative musculoskeletal ROS (+)    Abdominal   Peds  Hematology  (+) Blood dyscrasia, anemia   Anesthesia Other Findings   Reproductive/Obstetrics                             Anesthesia Physical Anesthesia Plan  ASA: 2  Anesthesia Plan: General   Post-op Pain Management: Tylenol PO (pre-op)*   Induction: Intravenous  PONV Risk Score and Plan: 1 and Treatment may vary due to age or medical condition, Ondansetron and Dexamethasone  Airway Management Planned: Oral ETT and Video Laryngoscope Planned  Additional Equipment: None  Intra-op Plan:   Post-operative Plan: Extubation in OR  Informed Consent: I have reviewed the patients History and Physical, chart, labs and discussed the procedure including the risks, benefits and alternatives for the proposed anesthesia with the patient or authorized representative who has indicated his/her understanding and acceptance.     Dental advisory given  Plan Discussed with: CRNA and Anesthesiologist  Anesthesia Plan Comments:        Anesthesia Quick Evaluation

## 2023-03-20 NOTE — Progress Notes (Signed)
Night cross-coverage  MRI department is requesting a 2 view lumbar x-ray prior to MRI, x-ray ordered.

## 2023-03-20 NOTE — Progress Notes (Addendum)
Pt back from Xray.  Verified stimulator and remote are both fully charged per MRI tech request. Pt has remote bedside as MRI tech states she has instructions to put stimulator on MRI Safe Mode via remote.

## 2023-03-20 NOTE — Anesthesia Procedure Notes (Signed)
Procedure Name: Intubation Date/Time: 03/20/2023 5:22 PM  Performed by: Rachel Moulds, CRNAPre-anesthesia Checklist: Patient identified, Emergency Drugs available, Suction available and Patient being monitored Patient Re-evaluated:Patient Re-evaluated prior to induction Oxygen Delivery Method: Circle system utilized Preoxygenation: Pre-oxygenation with 100% oxygen Induction Type: IV induction and Rapid sequence Ventilation: Mask ventilation without difficulty Laryngoscope Size: Glidescope and 4 Grade View: Grade I Tube type: Oral Tube size: 7.5 mm Number of attempts: 1 Airway Equipment and Method: Rigid stylet and Video-laryngoscopy Placement Confirmation: ETT inserted through vocal cords under direct vision, positive ETCO2 and breath sounds checked- equal and bilateral Secured at: 22 cm Tube secured with: Tape Dental Injury: Teeth and Oropharynx as per pre-operative assessment

## 2023-03-20 NOTE — Progress Notes (Signed)
MRI ordered for head and cervical spine. Pt has spinal stimulator with remote . Pt has remote in room and MRI card in chart. Called and spoke to MRI department. Pt will need 2 view AP and lateral lumbar Xray to verify placement and intact wire attachments of stimulator. MRI tech also states that only an MRI of brain can be performed and there is a 30 minute time limit for MRI d/t stimulator. Pt must be fully awake and Aox4 as there is potential for side effects from stimulator.  Will contact MD for Xray order. After Xray has been verified by MRI dept, MRI can be performed.

## 2023-03-20 NOTE — Consult Note (Addendum)
NEURO HOSPITALIST CONSULT NOTE   Requestig physician: Dr. Randol Kern  Reason for Consult: Progressive limb weakness with distal paresthesias  History obtained from:  Patient and Chart     HPI:                                                                                                                                          Sean Austin is an 72 y.o. male with a PMHx of HTN and spinal stimulator placement in 2022 for management of bilateral sciatica who presents with 6 weeks of progressive numbness and weakness of his proximal and distal upper and lower extremities in conjunction with paresthesias of his feet and fingers bilaterally. He got a CT head and CT spine in [place] which were reportedly negative. His weakness has progressed such that he can no longer get up and walk. His right side is a little worse than his left. The paresthesias are in a stocking and glove distribution. He reports chronic constipation. He denies any vision loss, trouble chewing or swallowing, dysarthria, double vision, droopy eyelids or neck weakness. He does endorse bilateral shoulder and neck pain. His spinal cord stimulator is MRI conditional. Per report, he has an "impressive" Hoffman's sign on the right and fasciculation in his thighs.   Past Medical History:  Diagnosis Date   Hypertension     History reviewed. No pertinent surgical history.  History reviewed. No pertinent family history.           Social History:  reports that he has been smoking cigarettes. He has never used smokeless tobacco. He reports current alcohol use. He reports that he does not use drugs.  Allergies  Allergen Reactions   Amlodipine Swelling   Ace Inhibitors Cough    MEDICATIONS:                                                                                                                     No current facility-administered medications on file prior to encounter.   Current Outpatient Medications on  File Prior to Encounter  Medication Sig Dispense Refill   aspirin 81 MG chewable tablet Chew 81 mg by mouth daily.     diltiazem (DILACOR XR) 180 MG 24 hr capsule Take 180 mg by mouth daily.  folic acid (FOLVITE) 400 MCG tablet Take 400 mcg by mouth daily.     olmesartan (BENICAR) 40 MG tablet Take 40 mg by mouth daily.     Omega-3 Fatty Acids (KP OMEGA-3 FISH OIL) 1200 MG CPDR Take 1 capsule by mouth daily.     Saw Palmetto, Serenoa repens, (SAW PALMETTO PO) Take 1 capsule by mouth daily. 320 mg.     simvastatin (ZOCOR) 40 MG tablet Take 40 mg by mouth daily.     tamsulosin (FLOMAX) 0.4 MG CAPS capsule Take 0.8 mg by mouth daily.     TURMERIC PO Take 500 mg by mouth every evening.     tiZANidine (ZANAFLEX) 4 MG tablet Take 4 mg by mouth at bedtime as needed for muscle spasms. (Patient not taking: Reported on 03/19/2023)      ROS:                                                                                                                                       Endorses a 4 week history of bilateral shoulder and neck pain which is partially relieved with Tylenol. Other ROS as per HPI.   Blood pressure (!) 147/78, pulse (!) 58, temperature 97.7 F (36.5 C), temperature source Oral, resp. rate 20, height 6\' 1"  (1.854 m), weight 83.3 kg, SpO2 94%.   General Examination:                                                                                                       Physical Exam  HEENT-  [place]/AT    Lungs- Respirations unlabored. No SOB. Not using accessory muscles of respiration.  Extremities- No edema   Neurological Examination Mental Status: Alert, oriented x 5, thought content appropriate.  Speech fluent without evidence of aphasia.  Able to follow all commands without difficulty. No dysarthria. Cranial Nerves: II: Temporal visual fields intact with no extinction to DSS. PERRL  III,IV, VI: No ptosis. EOMI.  V: FT sensation equal bilaterally  VII: Smile symmetric VIII: Hearing  intact to voice IX,X: No hoarseness or hypophonia XI: Symmetric shoulder shrug XII: Midline tongue extension with no fasciculations noted over a 45 second observation period. No muscle atrophy seen.  Motor: LUE 4/5 deltoid, biceps, triceps, finger abduction, finger extension and grip.  RUE 4/5 deltoid, biceps, triceps. 4-/5 finger abduction, finger extension and grip. LLE 4-/5 hip flexion, knee extension, knee flexion. 3/5 ADF and APF.  RLE 4-/5 hip flexion, knee extension, knee flexion. 3/5 ADF  and APF.  No pronator drift.  Sensory: Light touch intact throughout, bilaterally, including the feet and fingers. No extinction to DSS.  Deep Tendon Reflexes: 2+ bilateral triceps, 1+ bilateral biceps and brachioradialis. 4+ bilateral patellae (crossed adductor response). 0 bilateral achilles. Right toe upgoing, left toe equivocal.  Hoffman's negative bilaterally.  Cerebellar: No ataxia with FNF on the right. Positive for dyssinergia and mild action tremor with FNF on the left.   Gait: Deferred   Lab Results: Basic Metabolic Panel: Recent Labs  Lab 03/19/23 1825 03/20/23 0021  NA 135 134*  K 3.6 3.5  CL 101 100  CO2 25 24  GLUCOSE 134* 105*  BUN 12 11  CREATININE 0.79 0.89  CALCIUM 9.0 8.9  MG 2.0  --     CBC: Recent Labs  Lab 03/19/23 1825 03/20/23 0021  WBC 6.4 6.2  NEUTROABS 4.0  --   HGB 13.0 11.5*  HCT 38.0* 33.7*  MCV 95.2 94.4  PLT 269 277    Cardiac Enzymes: Recent Labs  Lab 03/19/23 1825  CKTOTAL 194    Lipid Panel: No results for input(s): "CHOL", "TRIG", "HDL", "CHOLHDL", "VLDL", "LDLCALC" in the last 168 hours.  Imaging: DG Chest 2 View  Result Date: 03/19/2023 CLINICAL DATA:  Generalized weakness. EXAM: CHEST - 2 VIEW COMPARISON:  August 20, 2016. FINDINGS: Stable cardiomediastinal silhouette. Both lungs are clear. The visualized skeletal structures are unremarkable. IMPRESSION: No active cardiopulmonary disease. Electronically Signed   By: Lupita Raider M.D.   On: 03/19/2023 18:24     Assessment: 72 year old male presenting with 6 weeks of progressive numbness and weakness of his proximal and distal upper and lower extremities in conjunction with paresthesias of his feet and fingers bilaterally.  - Exam reveals intact upper extremity reflexes, hyperreflexic patellar reflexes, absent achilles reflexes asymmetric BUE weakness worse on the right and distally, symmetric bilateral lower extremity weakness worse distally, and intact sensation. No bulbar weakness appreciated.  - Exam findings best localize as a possible cervical spinal or diffuse spinal cord lesion versus a polyradiculoneuropathy with incidental chronic hyperreflexia due to another etiology.  - DDx includes cervical myelopathy secondary to compressive or inflammatory lesion, AIDP, nutritional neuropathy, nutritional myelopathy, rare presentation of a slow infectious process or first attack of CIDP. Overall presentation is not consistent with stroke.     Recommendations: - MRI brain. Per MRI techs his spinal cord stimulator is MRI-compatible and brain scan can be performed, but they are unsure about the safety of scanning the spine. Given that the spinal cord stimulator for treatment of his sciatica is implanted subcutaneously in the region of his right buttock, I do not see any obvious contraindication to cervical spine MRI, although lumbar spine MRI may not be possible as that is the region the implant is located in.  - If MRI brain is negative, will need to readress MRI C-spine technical feasibility with Radiology.  - If MRI C-spine cannot be done, then will need CT myelogram of the neck.  - Will also need the following labs: B12, Folate, Copper, ceruloplasmin, Zinc, RPR, HIV. - If spine imaging is unremarkable, then spinal tap will be needed to assess for possible albuminocytologic dissociation.     Electronically signed: Dr. Caryl Pina 03/20/2023, 7:05 AM

## 2023-03-20 NOTE — Transfer of Care (Signed)
Immediate Anesthesia Transfer of Care Note  Patient: Sean Austin  Procedure(s) Performed: ANTERIOR CERVICAL DECOMPRESSION/DISCECTOMY FUSION CERVICAL FOUR-FIVE, CERVICAL FIVE-SIX  Patient Location: PACU  Anesthesia Type:General  Level of Consciousness: awake and alert   Airway & Oxygen Therapy: Patient Spontanous Breathing and Patient connected to face mask oxygen  Post-op Assessment: Report given to RN, Post -op Vital signs reviewed and stable, and Patient moving all extremities X 4  Post vital signs: Reviewed and stable  Last Vitals:  Vitals Value Taken Time  BP 136/65 03/20/23 1936  Temp    Pulse 90 03/20/23 1939  Resp 14 03/20/23 1939  SpO2 94 % 03/20/23 1939  Vitals shown include unfiled device data.  Last Pain:  Vitals:   03/20/23 1641  TempSrc: Oral  PainSc: 3       Patients Stated Pain Goal: 3 (03/19/23 2326)  Complications: No notable events documented.

## 2023-03-20 NOTE — Progress Notes (Signed)
PROGRESS NOTE    Sean Austin  AVW:[id number] DOB: [birth year] DOA: 03/19/2023 PCP: Lupita Raider, MD   Chief Complaint  Patient presents with   Weakness    Brief Narrative:   Sean Austin is a 72 y.o. male with a known history of hypertension, EtOH use low back pain status post spinal cord stimulator placed in 2022 presents to the emergency department for evaluation of weakness, and numbness, reported has been progressing over the last few weeks, but has much worsened over the last 2 to 3 days which prompted him to come to ED.  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 AP diameter of the canal in the midline as narrow as 3.7 mm. Cord compression with abnormal T2 signal within the cord emanating from this area consistent with compressive myelopathy.  - Neurosnrgery consulted.  For surgery this evening, Decadron per neurosurgery.  History of hypertension -Continue diltiazem, olmesartan, aspirin  History of alcohol abuse -Monitor for withdrawal - CIWA protocol as needed -Vitamins   History of nicotineuse disorder - Continue nicotine patch - Cessation advised   History of BPH - Continue tamsulosin   History of hyperlipidemia - Continue simvastatin  Elevated TSH -TSH elevated at 8.7, free T4 within normal limit, no other clinical findings concerning for hypothyroidism, this is most likely due to sick euthyroid syndrome, will hold on initiating Synthroid for now.    DVT  prophylaxis: CDs for now, will defer pharmacological DVT prophylaxis pending neurosurgery decision about surgery Code Status: Full Family Communication: none at bedside Disposition:   Status is: Inpatient    Consultants:  Neurology neurosurgery   Subjective:    Objective: Vitals:   03/19/23 2252 03/20/23 0012 03/20/23 0540 03/20/23 0817  BP: (!) 160/83 (!) 151/100 (!) 147/78 (!) 151/89  Pulse: 81 67 (!) 58 67  Resp: 20 20  18   Temp: 98 F (36.7 C) 97.7 F (36.5 C) 97.7 F (36.5 C) 97.7 F (36.5 C)  TempSrc: Oral  Oral Oral  SpO2: 94% 93% 94% 95%  Weight: 83.3 kg     Height: 6\' 1"  (1.854 m)       Intake/Output Summary (Last 24 hours) at 03/20/2023 1212 Last data filed at 03/20/2023 0900 Gross per 24 hour  Intake 240 ml  Output 300 ml  Net -60 ml   Filed Weights   03/19/23 2142 03/19/23 2252  Weight: 88.5 kg 83.3 kg    Examination:  Awake Alert, Oriented X 3, No new F.N deficits, Normal affect Symmetrical Chest wall movement, Good air movement bilaterally, CTAB RRR,No Gallops,Rubs or new Murmurs, No Parasternal Heave +ve B.Sounds, Abd Soft, No tenderness, No rebound - guarding or rigidity. No Cyanosis, Clubbing or edema, upper and lower extremity weakness noted.    Data Reviewed: I have personally reviewed following labs and imaging studies  CBC: Recent Labs  Lab 03/19/23 1825 03/20/23 0021  WBC 6.4 6.2  NEUTROABS 4.0  --   HGB 13.0 11.5*  HCT 38.0* 33.7*  MCV 95.2 94.4  PLT 269  277    Basic Metabolic Panel: Recent Labs  Lab 03/19/23 1825 03/20/23 0021  NA 135 134*  K 3.6 3.5  CL 101 100  CO2 25 24  GLUCOSE 134* 105*  BUN 12 11  CREATININE 0.79 0.89  CALCIUM 9.0 8.9  MG 2.0  --     GFR: Estimated Creatinine Clearance: 86 mL/min (by C-G formula based on SCr of 0.89 mg/dL).  Liver Function Tests: Recent Labs  Lab 03/19/23 1825 03/20/23 0021  AST 22 18  ALT 15 15  ALKPHOS 73 62  BILITOT 0.6 0.3  PROT 6.8 6.0*  ALBUMIN 3.8 3.3*     CBG: No results for input(s): "GLUCAP" in the last 168 hours.   Recent Results (from the past 240 hour(s))  MRSA Next Gen by PCR, Nasal     Status: None   Collection Time: 03/20/23 12:10 AM   Specimen: Nasal Mucosa; Nasal Swab  Result Value Ref Range Status   MRSA by PCR Next Gen NOT DETECTED NOT DETECTED Final    Comment: (NOTE) The GeneXpert MRSA Assay (FDA approved for NASAL specimens only), is one component of a comprehensive MRSA colonization surveillance program. It is not intended to diagnose MRSA infection nor to guide or monitor treatment for MRSA infections. Test performance is not FDA approved in patients less than 65 years old. Performed at [facility], [address] [place], [place] [address]          Radiology Studies: MR CERVICAL SPINE WO CONTRAST  Result Date: 03/20/2023 CLINICAL DATA:  Progressive numbness and weakness. Sensor E disturbance of all 4 extremities. EXAM: MRI CERVICAL SPINE WITHOUT CONTRAST TECHNIQUE: Multiplanar, multisequence MR imaging of the cervical spine was performed. No intravenous contrast was administered. COMPARISON:  No prior cervical imaging. FINDINGS: Alignment: Straightening of the normal cervical lordosis. Vertebrae: No fracture. Joint effusions of the facet joints at C4-5 suggesting that anterolisthesis could occur at this level with flexion. 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 See below. Posterior Fossa, vertebral arteries, paraspinal tissues: Negative Disc levels: The foramen magnum is widely patent. There is ordinary mild osteoarthritis of the C1-2 articulation but no encroachment upon the neural structures. C2-3: Unremarkable interspace. C3-4: Endplate osteophytes and shallow protrusion of the disc. Bilateral facet osteoarthritis. Canal narrowing with narrowing of the subarachnoid space surrounding the cord but no cord compression or deformity at this  level. Bilateral foraminal stenosis that could affect either C4 nerve. C4-5: Broad-based disc herniation. Bilateral facet arthropathy with hypertrophic change. Effusions in the facet joints suggest that anterolisthesis could occur at this level with flexion, worsening the stenosis. AP diameter of the canal in the midline as narrow as 3.7 mm. Abnormal T2 signal within the cord emanating from this area consistent with compressive myelopathy. Bilateral foraminal stenosis could affect either C5 nerve. C5-6: Endplate osteophytes and protrusion of the disc. Mild posterior element hypertrophy. Effacement of the subarachnoid space surrounding the cord with slight indentation of the cord. AP diameter of the canal in the midline as narrow as 7.2 mm. Some T2 signal within the cord at this level, probably emanating from the C4-5 level but possibly with some contribution from the stenosis at this level. Bilateral foraminal narrowing could affect either C6 nerve. C6-7: Endplate osteophytes and bulging of the disc. Narrowing of the ventral subarachnoid space but no compression of the cord. Bilateral foraminal stenosis could affect either C7 nerve. C7-T1: Mild disc bulge.  Bilateral facet osteoarthritis. No canal stenosis. Mild bilateral foraminal narrowing. T1-2: Bulging of the disc. No compressive canal stenosis. Mild bilateral foraminal narrowing. IMPRESSION: 1. 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 AP diameter of the canal in the midline as narrow as 3.7 mm. Cord compression with abnormal T2 signal within the cord emanating from this area consistent with compressive myelopathy. Effusions of the facet joints at this level suggest that anterolisthesis could occur at this level with flexion, worsening the stenosis. Neuro surgical consultation recommended. 2. Lesser spinal stenosis at C3-4 and C5-6, possibly with some degree of additional cord compression at the C5-6 level. 3. Bilateral foraminal stenosis from C3-4 through  C6-7 that could cause neural compression. 4. These results will be called to the ordering clinician or representative by the Radiologist Assistant, and communication documented in the PACS or [employer]. Electronically Signed   By: Paulina Fusi M.D.   On: 03/20/2023 11:17   MR BRAIN WO CONTRAST  Result Date: 03/20/2023 CLINICAL DATA:  Neuro deficit, acute, stroke suspected. Progressive numbness and weakness. Sensory defect. EXAM: MRI HEAD WITHOUT CONTRAST TECHNIQUE: Multiplanar, multiecho pulse sequences of the brain and surrounding structures were obtained without intravenous contrast. COMPARISON:  No prior brain imaging. FINDINGS: Brain: Diffusion imaging does not show any acute or subacute infarction or other cause of restricted diffusion. Minimal small vessel changes noted within the pons. No focal cerebellar insult. Cerebral hemispheres show age related volume loss with mild small vessel change of the deep white matter. No cortical or large vessel territory infarction. No finding to suggest demyelinating disease. No mass, hemorrhage, hydrocephalus or extra-axial collection. Few small foci of hemosiderin deposition associated with some of the old small vessel insults. Vascular: Major vessels at the base of the brain show flow. Skull and upper cervical spine: Negative Sinuses/Orbits: Mucosal inflammatory changes affecting the paranasal sinuses. Possible combination of mucosal inflammation, retention cysts and polyps. Orbits negative. Other: None IMPRESSION: 1. No acute or reversible finding. Mild age related volume loss. Minimal small vessel change of the pons and cerebral hemispheric white matter. 2. Mucosal inflammatory changes of the paranasal sinuses. Possible combination of mucosal inflammation, retention cysts and polyps. Electronically Signed   By: Paulina Fusi M.D.   On: 03/20/2023 11:10   DG Lumbar Spine 2-3 Views  Result Date: 03/20/2023 CLINICAL DATA:  [id number]. Status post insertion of  spinal cord stimulator. EXAM: LUMBAR SPINE - 2-3 VIEW COMPARISON:  None Available. FINDINGS: There is a subcutaneously implanted power source in the right flank. Spinal cord stimulator leads extend from the power source and enter the spinal canal at T12-L1 coursing cephalad off of the plane of imaging at T11. There is osteopenia. The lumbar vertebra are normal in heights with no evidence of fractures. Mild lumbar spondylosis. There is moderate disc space loss at L5-S1 and mild disc narrowing L4-5. Normal disc heights above L4. Slight dextrorotary scoliosis apex L3. At L4 there is grade 1 anterolisthesis most likely due to advanced facet hypertrophy at this level. There is otherwise normal alignment. There is mild arthritic facet change at L3-4 and L5-S1. The SI joints are unremarkable, as visualized. The aorta and common iliac arteries are heavily calcified. IMPRESSION: 1. Osteopenia and degenerative change without evidence of fractures. 2. Spinal cord stimulator leads extend from the power source and enter the spinal canal at T12-L1 coursing cephalad off of the plane of imaging at T11. 3. Slight scoliosis, and grade 1 degenerative L4-5 spondylolisthesis. 4. Aortoiliac atherosclerosis. Electronically Signed   By: Mellody Dance  Chesser M.D.   On: 03/20/2023 07:05   DG Chest 2 View  Result Date: 03/19/2023 CLINICAL DATA:  Generalized weakness. EXAM: CHEST - 2 VIEW COMPARISON:  August 20, 2016. FINDINGS: Stable cardiomediastinal silhouette. Both lungs are clear. The visualized skeletal structures are unremarkable. IMPRESSION: No active cardiopulmonary disease. Electronically Signed   By: Lupita Raider M.D.   On: 03/19/2023 18:24        Scheduled Meds:  aspirin  81 mg Oral Daily   atorvastatin  20 mg Oral Daily   diltiazem  180 mg Oral Daily   feeding supplement  237 mL Oral BID BM   folic acid  1 mg Oral Daily   heparin  5,000 Units Subcutaneous Q8H   irbesartan  300 mg Oral Daily   multivitamin with  minerals  1 tablet Oral Daily   sodium chloride flush  3 mL Intravenous Q12H   tamsulosin  0.8 mg Oral Daily   thiamine  100 mg Oral Daily   Or   thiamine  100 mg Intravenous Daily   Continuous Infusions:   LOS: 1 day      Huey Bienenstock, MD [facility]   To contact the attending provider between 7A-7P or the covering provider during after hours 7P-7A, please log into the web site [email or web address] and access using universal [place] password for that web site. If you do not have the password, please call the hospital operator.  03/20/2023, 12:12 PM

## 2023-03-20 NOTE — Progress Notes (Signed)
Pt off to Xray

## 2023-03-20 NOTE — Consult Note (Addendum)
Reason for Consult:cervical myelopathy Referring Physician: [facility]  Sean Austin is an 72 y.o. male.   HPI:  72 year old male presented to the hospital with progressive weakness over the last 6 weeks. He states that he was traveling in [place] when all of this started. He eventually had to start using a walker to get around with but even that was difficult because he was developing weakness in his arms and hands. He denies any pain but does report some numbness in his hands and feet. He has noticed some swelling in his legs for greater than 6 weeks. He is a smoker   Past Medical History:  Diagnosis Date   Hypertension     History reviewed. No pertinent surgical history.  Allergies  Allergen Reactions   Amlodipine Swelling   Ace Inhibitors Cough    Social History   Tobacco Use   Smoking status: Every Day    Current packs/day: 1.00    Types: Cigarettes   Smokeless tobacco: Never  Substance Use Topics   Alcohol use: Yes    History reviewed. No pertinent family history.   Review of Systems  Positive ROS: as above  All other systems have been reviewed and were otherwise negative with the exception of those mentioned in the HPI and as above.  Objective: Vital signs in last 24 hours: Temp:  [97.5 F (36.4 C)-98 F (36.7 C)] 97.7 F (36.5 C) (08/07 0817) Pulse Rate:  [58-89] 67 (08/07 0817) Resp:  [12-23] 18 (08/07 0817) BP: (145-170)/(71-100) 151/89 (08/07 0817) SpO2:  [93 %-100 %] 95 % (08/07 0817) Weight:  [83.3 kg-88.5 kg] 83.3 kg (08/06 2252)  General Appearance: Alert, cooperative, no distress, appears stated age Head: Normocephalic, without obvious abnormality, atraumatic Eyes: PERRL, conjunctiva/corneas clear, EOM's intact, fundi benign, both eyes      Lungs:  respirations unlabored Heart: Regular rate and rhythm, Extremities: Extremities normal, atraumatic, no cyanosis or edema Pulses: 2+ and symmetric all extremities Skin: Skin color, texture,  turgor normal, no rashes or lesions  NEUROLOGIC:   Mental status: A&O x4, no aphasia, good attention span, Memory and fund of knowledge Motor Exam - grossly normal, right tricep 4-/5, contracture in right hand  Sensory Exam - grossly normal Reflexes: symmetric, no pathologic reflexes, No Hoffman's, No clonus Coordination - grossly normal Gait - not tested Balance - not tested Cranial Nerves: I: smell Not tested  II: visual acuity  OS: na    OD: na  II: visual fields Full to confrontation  II: pupils Equal, round, reactive to light  III,VII: ptosis None  III,IV,VI: extraocular muscles  Full ROM  V: mastication   V: facial light touch sensation    V,VII: corneal reflex    VII: facial muscle function - upper    VII: facial muscle function - lower   VIII: hearing   IX: soft palate elevation    IX,X: gag reflex   XI: trapezius strength    XI: sternocleidomastoid strength   XI: neck flexion strength    XII: tongue strength      Data Review Lab Results  Component Value Date   WBC 6.2 03/20/2023   HGB 11.5 (L) 03/20/2023   HCT 33.7 (L) 03/20/2023   MCV 94.4 03/20/2023   PLT 277 03/20/2023   Lab Results  Component Value Date   NA 134 (L) 03/20/2023   K 3.5 03/20/2023   CL 100 03/20/2023   CO2 24 03/20/2023   BUN 11 03/20/2023   CREATININE 0.89  03/20/2023   GLUCOSE 105 (H) 03/20/2023   No results found for: "INR", "PROTIME"  Radiology: MR CERVICAL SPINE WO CONTRAST  Result Date: 03/20/2023 CLINICAL DATA:  Progressive numbness and weakness. Sensor E disturbance of all 4 extremities. EXAM: MRI CERVICAL SPINE WITHOUT CONTRAST TECHNIQUE: Multiplanar, multisequence MR imaging of the cervical spine was performed. No intravenous contrast was administered. COMPARISON:  No prior cervical imaging. FINDINGS: Alignment: Straightening of the normal cervical lordosis. Vertebrae: No fracture. Joint effusions of the facet joints at C4-5 suggesting that anterolisthesis could occur at this  level with flexion. 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 See below. Posterior Fossa, vertebral arteries, paraspinal tissues: Negative Disc levels: The foramen magnum is widely patent. There is ordinary mild osteoarthritis of the C1-2 articulation but no encroachment upon the neural structures. C2-3: Unremarkable interspace. C3-4: Endplate osteophytes and shallow protrusion of the disc. Bilateral facet osteoarthritis. Canal narrowing with narrowing of the subarachnoid space surrounding the cord but no cord compression or deformity at this level. Bilateral foraminal stenosis that could affect either C4 nerve. C4-5: Broad-based disc herniation. Bilateral facet arthropathy with hypertrophic change. Effusions in the facet joints suggest that anterolisthesis could occur at this level with flexion, worsening the stenosis. AP diameter of the canal in the midline as narrow as 3.7 mm. Abnormal T2 signal within the cord emanating from this area consistent with compressive myelopathy. Bilateral foraminal stenosis could affect either C5 nerve. C5-6: Endplate osteophytes and protrusion of the disc. Mild posterior element hypertrophy. Effacement of the subarachnoid space surrounding the cord with slight indentation of the cord. AP diameter of the canal in the midline as narrow as 7.2 mm. Some T2 signal within the cord at this level, probably emanating from the C4-5 level but possibly with some contribution from the stenosis at this level. Bilateral foraminal narrowing could affect either C6 nerve. C6-7: Endplate osteophytes and bulging of the disc. Narrowing of the ventral subarachnoid space but no compression of the cord. Bilateral foraminal stenosis could affect either C7 nerve. C7-T1: Mild disc bulge. Bilateral facet osteoarthritis. No canal stenosis. Mild bilateral foraminal narrowing. T1-2: Bulging of the disc. No compressive canal stenosis. Mild  bilateral foraminal narrowing. IMPRESSION: 1. 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 AP diameter of the canal in the midline as narrow as 3.7 mm. Cord compression with abnormal T2 signal within the cord emanating from this area consistent with compressive myelopathy. Effusions of the facet joints at this level suggest that anterolisthesis could occur at this level with flexion, worsening the stenosis. Neuro surgical consultation recommended. 2. Lesser spinal stenosis at C3-4 and C5-6, possibly with some degree of additional cord compression at the C5-6 level. 3. Bilateral foraminal stenosis from C3-4 through C6-7 that could cause neural compression. 4. These results will be called to the ordering clinician or representative by the Radiologist Assistant, and communication documented in the PACS or [employer]. Electronically Signed   By: Paulina Fusi M.D.   On: 03/20/2023 11:17   MR BRAIN WO CONTRAST  Result Date: 03/20/2023 CLINICAL DATA:  Neuro deficit, acute, stroke suspected. Progressive numbness and weakness. Sensory defect. EXAM: MRI HEAD WITHOUT CONTRAST TECHNIQUE: Multiplanar, multiecho pulse sequences of the brain and surrounding structures were obtained without intravenous contrast. COMPARISON:  No prior brain imaging. FINDINGS: Brain: Diffusion imaging does not show any acute or subacute infarction or other cause of restricted diffusion. Minimal small vessel changes noted within the pons. No focal  cerebellar insult. Cerebral hemispheres show age related volume loss with mild small vessel change of the deep white matter. No cortical or large vessel territory infarction. No finding to suggest demyelinating disease. No mass, hemorrhage, hydrocephalus or extra-axial collection. Few small foci of hemosiderin deposition associated with some of the old small vessel insults. Vascular: Major vessels at the base of the brain show flow. Skull and upper cervical spine: Negative Sinuses/Orbits: Mucosal  inflammatory changes affecting the paranasal sinuses. Possible combination of mucosal inflammation, retention cysts and polyps. Orbits negative. Other: None IMPRESSION: 1. No acute or reversible finding. Mild age related volume loss. Minimal small vessel change of the pons and cerebral hemispheric white matter. 2. Mucosal inflammatory changes of the paranasal sinuses. Possible combination of mucosal inflammation, retention cysts and polyps. Electronically Signed   By: Paulina Fusi M.D.   On: 03/20/2023 11:10   DG Lumbar Spine 2-3 Views  Result Date: 03/20/2023 CLINICAL DATA:  [id number]. Status post insertion of spinal cord stimulator. EXAM: LUMBAR SPINE - 2-3 VIEW COMPARISON:  None Available. FINDINGS: There is a subcutaneously implanted power source in the right flank. Spinal cord stimulator leads extend from the power source and enter the spinal canal at T12-L1 coursing cephalad off of the plane of imaging at T11. There is osteopenia. The lumbar vertebra are normal in heights with no evidence of fractures. Mild lumbar spondylosis. There is moderate disc space loss at L5-S1 and mild disc narrowing L4-5. Normal disc heights above L4. Slight dextrorotary scoliosis apex L3. At L4 there is grade 1 anterolisthesis most likely due to advanced facet hypertrophy at this level. There is otherwise normal alignment. There is mild arthritic facet change at L3-4 and L5-S1. The SI joints are unremarkable, as visualized. The aorta and common iliac arteries are heavily calcified. IMPRESSION: 1. Osteopenia and degenerative change without evidence of fractures. 2. Spinal cord stimulator leads extend from the power source and enter the spinal canal at T12-L1 coursing cephalad off of the plane of imaging at T11. 3. Slight scoliosis, and grade 1 degenerative L4-5 spondylolisthesis. 4. Aortoiliac atherosclerosis. Electronically Signed   By: Almira Bar M.D.   On: 03/20/2023 07:05   DG Chest 2 View  Result Date:  03/19/2023 CLINICAL DATA:  Generalized weakness. EXAM: CHEST - 2 VIEW COMPARISON:  August 20, 2016. FINDINGS: Stable cardiomediastinal silhouette. Both lungs are clear. The visualized skeletal structures are unremarkable. IMPRESSION: No active cardiopulmonary disease. Electronically Signed   By: Lupita Raider M.D.   On: 03/19/2023 18:24     Assessment/Plan: 72 year old male presneted to the hospital with progressive weakness in upper and lower extremities. MRI c spine shows severe spinal stenosis at C4-5 with a herniated disc and moderate spinal stenosis at C5-C6 with facet arthropathy. With his progressive neurologic deficits I do think he needs cervical intervention. He is going to need an ACDF at C4-5 and C5-6. We did discuss a posterior approach at a later date but we will see how much he improves first. We discussed all the risks and benefits associated with the surgery, he understood and agrees to move forward. We will plan for surgery later this afternoon. Please keep NPO.    Tiana Loft Meyran 03/20/2023 12:13 PM  Agree with above patient with compressive myelopathy signal change in spinal cord and progressive weakness.  We have extensively gone over the risks and benefits perioperative course expectations of outcome and alternatives to surgery and he understands and agrees to proceed forward.

## 2023-03-20 NOTE — Op Note (Signed)
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  Postoperative diagnosis: Same.  Procedure: Anterior cervical discectomies and fusion at C4-5 C5-6 utilizing allograft spacers and the globus resonate plating system with anterior cervical plating  Surgeon: Jillyn Hidden   Assistant: Francoise Schaumann  Anesthesia: General  EBL: Minimal  HPI: 72 year old gentleman presented with progressive worsening numbness tingling weakness worse on the right over the last few days but really over the last 2 weeks.  Patient noted did difficulty walking stumbling his gait weakness in his leg and weakness in his arm and hands.  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  Due to patient's progression of clinical syndrome imaging findings of a conservative treatment I recommend anterior cervical discectomies and fusion at those 2 levels.  I extensively reviewed the risks and benefits of the operation with him as well as perioperative course expectations of outcome and alternatives to surgery and he understood and agreed to proceed forward.  Operative procedure: Patient was brought into the OR was induced on general esthesia positioned supine the neck in slight extension 5 pounds of halter traction.  The right side of his neck was prepped and draped in routine sterile fashion.  Preoperative x-ray localized the appropriate level so a curvilinear incision was made just off the midline to the anterior border of the sternocleidomastoid and the superficial epithelium was dissected out and divided longitudinally.  The avascular plane between the sternomastoid and strap muscle was developed down to the prevertebral fascia and prevertebral fascia was dissected way with Kitners.  Intraoperative x-ray confirmed defecation appropriate level so annulotomy was were made with a 15 blade  scalpel marked to space longus close deflected laterally and self-retaining retractors were placed.  Both disc bases were then drilled down scraping the disc with upgoing curettes and drilling off the posterior annuluscomplex.  Under microscopic mentation first working at C4-5 the pathology here was large soft disc herniation migrating subligamentous causing severe cord compression after the posterior large ligament was removed nerve hook was used to fish out several large fibrous disc causing severe cord compression.  Aggressive under biting both endplates decompress central canal and marching laterally both C5 nerve roots were identified and both C5 nerve roots was skeletonized flush with the pedicle.  Then this was packed with Gelfoam attention taken at C5-6 pathology here was primarily spondylosis and severe degenerative collapse but the disc base was drilled down the posterior annulus notch but complex aggressive undermining both endplates decompressed central canal both C6 pedicles were identifiable C6 nerve roots were decompressed and skeletonized flush with the pedicle.  At the end decompression was no further stenosis either centrally or foraminally I then sized up the allograft 7 mm lordotic at each they were inserted 2 mm deep to anterior to bilateral then a selected a 34 mm globus resonate plate all screws had excellent purchase log [employer] and was gauged.  Wounds and copious irrigant hemostasis was maintained the wounds and closed in layers with Vicryl and the participant a running 4 subcuticular.  Dermabond benzoin Steri-Strips and a sterile dressing was applied patient recovery in stable condition.  At the end the case all needle counts and sponge counts were correct.

## 2023-03-20 NOTE — Progress Notes (Signed)
Brief Neuro Update:  Noted Cervical cord compression would explain his symptoms. Agree with neurosurgical consulatation.  We will signoff. Please feel free to contact us with any questions or concerns.  Erick Blinks [facility]

## 2023-03-21 ENCOUNTER — Encounter (HOSPITAL_COMMUNITY): Payer: Self-pay | Admitting: Neurosurgery

## 2023-03-21 ENCOUNTER — Inpatient Hospital Stay (HOSPITAL_COMMUNITY): Payer: Medicare Other

## 2023-03-21 DIAGNOSIS — R29898 Other symptoms and signs involving the musculoskeletal system: Secondary | ICD-10-CM | POA: Diagnosis not present

## 2023-03-21 DIAGNOSIS — R531 Weakness: Secondary | ICD-10-CM | POA: Diagnosis not present

## 2023-03-21 LAB — GLUCOSE, CAPILLARY: Glucose-Capillary: 154 mg/dL — ABNORMAL HIGH (ref 70–99)

## 2023-03-21 MED ORDER — IOHEXOL 9 MG/ML PO SOLN
500.0000 mL | ORAL | Status: AC
  Administered 2023-03-21 (×2): 500 mL via ORAL

## 2023-03-21 MED ORDER — DEXAMETHASONE SODIUM PHOSPHATE 10 MG/ML IJ SOLN
6.0000 mg | Freq: Four times a day (QID) | INTRAMUSCULAR | Status: DC
  Administered 2023-03-21 – 2023-03-22 (×3): 6 mg via INTRAVENOUS
  Filled 2023-03-21 (×3): qty 1

## 2023-03-21 MED ORDER — IOHEXOL 350 MG/ML SOLN
75.0000 mL | Freq: Once | INTRAVENOUS | Status: AC | PRN
  Administered 2023-03-21: 75 mL via INTRAVENOUS

## 2023-03-21 MED FILL — Thrombin For Soln 5000 Unit: CUTANEOUS | Qty: 2 | Status: AC

## 2023-03-21 NOTE — Plan of Care (Signed)
  Problem: Education: Goal: Knowledge of General Education information will improve Description Including pain rating scale, medication(s)/side effects and non-pharmacologic comfort measures Outcome: Progressing   

## 2023-03-21 NOTE — Progress Notes (Signed)
PROGRESS NOTE    Sean Austin  QMV:[id number] DOB: [birth year] DOA: 03/19/2023 PCP: Lupita Raider, MD   Chief Complaint  Patient presents with   Weakness    Brief Narrative:   Sean Austin is a 72 y.o. male with a known history of hypertension, EtOH use low back pain status post spinal cord stimulator placed in 2022 presents to the emergency department for evaluation of weakness, and numbness, reported has been progressing over the last few weeks, but has much worsened over the last 2 to 3 days which prompted him to come to ED.  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  -Initial labs including RPR, HIV, B12 and folic acid are nonconcerning -TSH mildly elevated, please see discussion below-follow-up zinc, copper, ceruloplasmin levels -MRI brain, and C-spine were obtained earlier today, no acute finding in MRI brain, but MRI cervical spine with concern of Severe multifactorial spinal stenosis at C4-5. AP diameter of the canal in the midline as narrow as 3.7 mm. Cord compression with abnormal T2 signal within the cord emanating from this area consistent with compressive myelopathy.  - Neurosnrgery greatly appreciated, status post Anterior cervical discectomies and fusion at C4-5 C5-6 utilizing allograft spacers and the globus resonate plating system with anterior cervical plating  -Steroids per neurosurgery -Hold on DVT prophylaxis for 72 hours postoperatively  History of hypertension -Continue diltiazem, olmesartan, aspirin  History of alcohol abuse -Monitor for withdrawal - CIWA protocol as needed -Vitamins   History of nicotineuse disorder - Continue nicotine patch - Cessation advised   History of BPH - Continue tamsulosin    History of hyperlipidemia - Continue simvastatin  Weight loss -Patient reports 16 pounds weight loss over the last 54-month, obtain CT chest/abdomen/pelvis for malignancy  Elevated TSH -TSH elevated at 8.7, free T4 within normal limit, no other clinical findings concerning for hypothyroidism, this is most likely due to sick euthyroid syndrome, will hold on initiating Synthroid for now.    DVT prophylaxis: SCDs for now defer pharmacological DVT prophylaxis for 72 hours postoperatively Code Status: Full Family Communication: none at bedside Disposition:   Status is: Inpatient    Consultants:  Neurology neurosurgery   Subjective:  No significant events overnight, he denies any complaints today, he reports no significant change in his weakness  Objective: Vitals:   03/20/23 2316 03/21/23 0308 03/21/23 0900 03/21/23 1122  BP: 130/73 (!) 144/72 (!) 148/79 (!) 149/73  Pulse: 69 64 78   Resp:   20 20  Temp: 97.9 F (36.6 C) (!) 97 F (36.1 C) (!) 97.5 F (36.4 C) 98 F (36.7 C)  TempSrc: Oral Oral Oral Oral  SpO2: 95% 95% 93%   Weight:  87.3 kg    Height:        Intake/Output Summary (Last 24 hours) at 03/21/2023 1303 Last data filed at 03/21/2023 1100 Gross per 24 hour  Intake 1363 ml  Output 1520 ml  Net -157 ml   Filed Weights   03/19/23 2252 03/20/23 1656 03/21/23 0308  Weight: 83.3 kg 83.3 kg 87.3 kg    Examination:  Awake Alert, Oriented X 3,  Normal affect Surgical wound in the right neck area covered with mesh. Symmetrical Chest wall movement, Good air movement bilaterally, CTAB RRR,No Gallops,Rubs  or new Murmurs, No Parasternal Heave +ve B.Sounds, Abd Soft, No tenderness, No rebound - guarding or rigidity. No Cyanosis, Clubbing or edema, No new Rash or bruise      Data Reviewed: I have personally reviewed following labs and imaging studies  CBC: Recent Labs  Lab 03/19/23 1825 03/20/23 0021 03/21/23 0020  WBC 6.4 6.2 7.4  NEUTROABS 4.0  --    --   HGB 13.0 11.5* 12.2*  HCT 38.0* 33.7* 35.4*  MCV 95.2 94.4 92.9  PLT 269 277 287    Basic Metabolic Panel: Recent Labs  Lab 03/19/23 1825 03/20/23 0021 03/21/23 0020  NA 135 134* 131*  K 3.6 3.5 4.2  CL 101 100 99  CO2 25 24 22   GLUCOSE 134* 105* 145*  BUN 12 11 13   CREATININE 0.79 0.89 0.81  CALCIUM 9.0 8.9 8.9  MG 2.0  --   --     GFR: Estimated Creatinine Clearance: 94.5 mL/min (by C-G formula based on SCr of 0.81 mg/dL).  Liver Function Tests: Recent Labs  Lab 03/19/23 1825 03/20/23 0021  AST 22 18  ALT 15 15  ALKPHOS 73 62  BILITOT 0.6 0.3  PROT 6.8 6.0*  ALBUMIN 3.8 3.3*    CBG: Recent Labs  Lab 03/21/23 1118  GLUCAP 154*     Recent Results (from the past 240 hour(s))  MRSA Next Gen by PCR, Nasal     Status: None   Collection Time: 03/20/23 12:10 AM   Specimen: Nasal Mucosa; Nasal Swab  Result Value Ref Range Status   MRSA by PCR Next Gen NOT DETECTED NOT DETECTED Final    Comment: (NOTE) The GeneXpert MRSA Assay (FDA approved for NASAL specimens only), is one component of a comprehensive MRSA colonization surveillance program. It is not intended to diagnose MRSA infection nor to guide or monitor treatment for MRSA infections. Test performance is not FDA approved in patients less than 74 years old. Performed at [facility], [address], [place], [place] [address]          Radiology Studies: DG Cervical Spine 1 View  Result Date: 03/20/2023 CLINICAL DATA:  Surgical anterior fusion of C4-5 and C5-6. EXAM: DG CERVICAL SPINE - 1 VIEW; DG C-ARM 1-60 MIN-NO REPORT Radiation exposure index: 0.58 mGy. COMPARISON:  None Available. FINDINGS: Two intraoperative fluoroscopic images were obtained of the cervical spine. First image demonstrates surgical localization of anterior portion of C5-6 disc space. Second image demonstrates the patient to be status post surgical anterior fusion of C4-5 and C5-6. IMPRESSION: Fluoroscopic guidance  provided during surgical anterior fusion of C4-5 and C5-6. Electronically Signed   By: Lupita Raider M.D.   On: 03/20/2023 19:53   DG C-Arm 1-60 Min-No Report  Result Date: 03/20/2023 Fluoroscopy was utilized by the requesting physician.  No radiographic interpretation.   DG C-Arm 1-60 Min-No Report  Result Date: 03/20/2023 Fluoroscopy was utilized by the requesting physician.  No radiographic interpretation.   MR CERVICAL SPINE WO CONTRAST  Result Date: 03/20/2023 CLINICAL DATA:  Progressive numbness and weakness. Sensor E disturbance of all 4 extremities. EXAM: MRI CERVICAL SPINE WITHOUT CONTRAST TECHNIQUE: Multiplanar, multisequence MR imaging of the cervical spine was performed. No intravenous contrast was administered. COMPARISON:  No prior cervical imaging. FINDINGS: Alignment: Straightening of the normal cervical lordosis. Vertebrae: No fracture. Joint effusions of the facet joints at C4-5 suggesting that anterolisthesis could occur at this level with flexion. Cord: Abnormal T2 signal in the cord from C3-4  through C5-6 due to compressive myelopathy at the C4-5 level. See below. Posterior Fossa, vertebral arteries, paraspinal tissues: Negative Disc levels: The foramen magnum is widely patent. There is ordinary mild osteoarthritis of the C1-2 articulation but no encroachment upon the neural structures. C2-3: Unremarkable interspace. C3-4: Endplate osteophytes and shallow protrusion of the disc. Bilateral facet osteoarthritis. Canal narrowing with narrowing of the subarachnoid space surrounding the cord but no cord compression or deformity at this level. Bilateral foraminal stenosis that could affect either C4 nerve. C4-5: Broad-based disc herniation. Bilateral facet arthropathy with hypertrophic change. Effusions in the facet joints suggest that anterolisthesis could occur at this level with flexion, worsening the stenosis. AP diameter of the canal in the midline as narrow as 3.7 mm. Abnormal T2  signal within the cord emanating from this area consistent with compressive myelopathy. Bilateral foraminal stenosis could affect either C5 nerve. C5-6: Endplate osteophytes and protrusion of the disc. Mild posterior element hypertrophy. Effacement of the subarachnoid space surrounding the cord with slight indentation of the cord. AP diameter of the canal in the midline as narrow as 7.2 mm. Some T2 signal within the cord at this level, probably emanating from the C4-5 level but possibly with some contribution from the stenosis at this level. Bilateral foraminal narrowing could affect either C6 nerve. C6-7: Endplate osteophytes and bulging of the disc. Narrowing of the ventral subarachnoid space but no compression of the cord. Bilateral foraminal stenosis could affect either C7 nerve. C7-T1: Mild disc bulge. Bilateral facet osteoarthritis. No canal stenosis. Mild bilateral foraminal narrowing. T1-2: Bulging of the disc. No compressive canal stenosis. Mild bilateral foraminal narrowing. IMPRESSION: 1. 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 AP diameter of the canal in the midline as narrow as 3.7 mm. Cord compression with abnormal T2 signal within the cord emanating from this area consistent with compressive myelopathy. Effusions of the facet joints at this level suggest that anterolisthesis could occur at this level with flexion, worsening the stenosis. Neuro surgical consultation recommended. 2. Lesser spinal stenosis at C3-4 and C5-6, possibly with some degree of additional cord compression at the C5-6 level. 3. Bilateral foraminal stenosis from C3-4 through C6-7 that could cause neural compression. 4. These results will be called to the ordering clinician or representative by the Radiologist Assistant, and communication documented in the PACS or [employer]. Electronically Signed   By: Paulina Fusi M.D.   On: 03/20/2023 11:17   MR BRAIN WO CONTRAST  Result Date: 03/20/2023 CLINICAL DATA:  Neuro  deficit, acute, stroke suspected. Progressive numbness and weakness. Sensory defect. EXAM: MRI HEAD WITHOUT CONTRAST TECHNIQUE: Multiplanar, multiecho pulse sequences of the brain and surrounding structures were obtained without intravenous contrast. COMPARISON:  No prior brain imaging. FINDINGS: Brain: Diffusion imaging does not show any acute or subacute infarction or other cause of restricted diffusion. Minimal small vessel changes noted within the pons. No focal cerebellar insult. Cerebral hemispheres show age related volume loss with mild small vessel change of the deep white matter. No cortical or large vessel territory infarction. No finding to suggest demyelinating disease. No mass, hemorrhage, hydrocephalus or extra-axial collection. Few small foci of hemosiderin deposition associated with some of the old small vessel insults. Vascular: Major vessels at the base of the brain show flow. Skull and upper cervical spine: Negative Sinuses/Orbits: Mucosal inflammatory changes affecting the paranasal sinuses. Possible combination of mucosal inflammation, retention cysts and polyps. Orbits negative. Other: None IMPRESSION: 1. No acute or reversible finding. Mild age related volume loss. Minimal small  vessel change of the pons and cerebral hemispheric white matter. 2. Mucosal inflammatory changes of the paranasal sinuses. Possible combination of mucosal inflammation, retention cysts and polyps. Electronically Signed   By: Paulina Fusi M.D.   On: 03/20/2023 11:10   DG Lumbar Spine 2-3 Views  Result Date: 03/20/2023 CLINICAL DATA:  [id number]. Status post insertion of spinal cord stimulator. EXAM: LUMBAR SPINE - 2-3 VIEW COMPARISON:  None Available. FINDINGS: There is a subcutaneously implanted power source in the right flank. Spinal cord stimulator leads extend from the power source and enter the spinal canal at T12-L1 coursing cephalad off of the plane of imaging at T11. There is osteopenia. The lumbar vertebra are  normal in heights with no evidence of fractures. Mild lumbar spondylosis. There is moderate disc space loss at L5-S1 and mild disc narrowing L4-5. Normal disc heights above L4. Slight dextrorotary scoliosis apex L3. At L4 there is grade 1 anterolisthesis most likely due to advanced facet hypertrophy at this level. There is otherwise normal alignment. There is mild arthritic facet change at L3-4 and L5-S1. The SI joints are unremarkable, as visualized. The aorta and common iliac arteries are heavily calcified. IMPRESSION: 1. Osteopenia and degenerative change without evidence of fractures. 2. Spinal cord stimulator leads extend from the power source and enter the spinal canal at T12-L1 coursing cephalad off of the plane of imaging at T11. 3. Slight scoliosis, and grade 1 degenerative L4-5 spondylolisthesis. 4. Aortoiliac atherosclerosis. Electronically Signed   By: Almira Bar M.D.   On: 03/20/2023 07:05   DG Chest 2 View  Result Date: 03/19/2023 CLINICAL DATA:  Generalized weakness. EXAM: CHEST - 2 VIEW COMPARISON:  August 20, 2016. FINDINGS: Stable cardiomediastinal silhouette. Both lungs are clear. The visualized skeletal structures are unremarkable. IMPRESSION: No active cardiopulmonary disease. Electronically Signed   By: Lupita Raider M.D.   On: 03/19/2023 18:24        Scheduled Meds:  aspirin  81 mg Oral Daily   atorvastatin  20 mg Oral Daily   dexamethasone (DECADRON) injection  10 mg Intravenous Q6H   diltiazem  180 mg Oral Daily   feeding supplement  237 mL Oral BID BM   folic acid  1 mg Oral Daily   irbesartan  300 mg Oral Daily   multivitamin with minerals  1 tablet Oral Daily   pantoprazole (PROTONIX) IV  40 mg Intravenous Q12H   sodium chloride flush  3 mL Intravenous Q12H   tamsulosin  0.8 mg Oral Daily   thiamine  100 mg Oral Daily   Or   thiamine  100 mg Intravenous Daily   Continuous Infusions:   LOS: 2 days      Huey Bienenstock, MD [facility]   To  contact the attending provider between 7A-7P or the covering provider during after hours 7P-7A, please log into the web site [email or web address] and access using universal [place] password for that web site. If you do not have the password, please call the hospital operator.  03/21/2023, 1:03 PM

## 2023-03-21 NOTE — Progress Notes (Signed)
Subjective: Patient reports overall doing well does not notice much difference yet but has not got up with physical therapy yet  Objective: Vital signs in last 24 hours: Temp:  [97 F (36.1 C)-98.2 F (36.8 C)] 97 F (36.1 C) (08/08 0308) Pulse Rate:  [64-82] 64 (08/08 0308) Resp:  [7-20] 18 (08/07 2201) BP: (125-158)/(65-96) 144/72 (08/08 0308) SpO2:  [90 %-95 %] 95 % (08/08 0308) Weight:  [83.3 kg-87.3 kg] 87.3 kg (08/08 0308)  Intake/Output from previous day: 08/07 0701 - 08/08 0700 In: 1243 [P.O.:240; I.V.:1003] Out: 1120 [Urine:1100; Blood:20] Intake/Output this shift: No intake/output data recorded.  Awake alert strength 5 out of 5 right tricep weakness incision clean dry and intact  Lab Results: Recent Labs    03/20/23 0021 03/21/23 0020  WBC 6.2 7.4  HGB 11.5* 12.2*  HCT 33.7* 35.4*  PLT 277 287   BMET Recent Labs    03/20/23 0021 03/21/23 0020  NA 134* 131*  K 3.5 4.2  CL 100 99  CO2 24 22  GLUCOSE 105* 145*  BUN 11 13  CREATININE 0.89 0.81  CALCIUM 8.9 8.9    Studies/Results: DG Cervical Spine 1 View  Result Date: 03/20/2023 CLINICAL DATA:  Surgical anterior fusion of C4-5 and C5-6. EXAM: DG CERVICAL SPINE - 1 VIEW; DG C-ARM 1-60 MIN-NO REPORT Radiation exposure index: 0.58 mGy. COMPARISON:  None Available. FINDINGS: Two intraoperative fluoroscopic images were obtained of the cervical spine. First image demonstrates surgical localization of anterior portion of C5-6 disc space. Second image demonstrates the patient to be status post surgical anterior fusion of C4-5 and C5-6. IMPRESSION: Fluoroscopic guidance provided during surgical anterior fusion of C4-5 and C5-6. Electronically Signed   By: Lupita Raider M.D.   On: 03/20/2023 19:53   DG C-Arm 1-60 Min-No Report  Result Date: 03/20/2023 Fluoroscopy was utilized by the requesting physician.  No radiographic interpretation.   DG C-Arm 1-60 Min-No Report  Result Date: 03/20/2023 Fluoroscopy was  utilized by the requesting physician.  No radiographic interpretation.   MR CERVICAL SPINE WO CONTRAST  Result Date: 03/20/2023 CLINICAL DATA:  Progressive numbness and weakness. Sensor E disturbance of all 4 extremities. EXAM: MRI CERVICAL SPINE WITHOUT CONTRAST TECHNIQUE: Multiplanar, multisequence MR imaging of the cervical spine was performed. No intravenous contrast was administered. COMPARISON:  No prior cervical imaging. FINDINGS: Alignment: Straightening of the normal cervical lordosis. Vertebrae: No fracture. Joint effusions of the facet joints at C4-5 suggesting that anterolisthesis could occur at this level with flexion. 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 See below. Posterior Fossa, vertebral arteries, paraspinal tissues: Negative Disc levels: The foramen magnum is widely patent. There is ordinary mild osteoarthritis of the C1-2 articulation but no encroachment upon the neural structures. C2-3: Unremarkable interspace. C3-4: Endplate osteophytes and shallow protrusion of the disc. Bilateral facet osteoarthritis. Canal narrowing with narrowing of the subarachnoid space surrounding the cord but no cord compression or deformity at this level. Bilateral foraminal stenosis that could affect either C4 nerve. C4-5: Broad-based disc herniation. Bilateral facet arthropathy with hypertrophic change. Effusions in the facet joints suggest that anterolisthesis could occur at this level with flexion, worsening the stenosis. AP diameter of the canal in the midline as narrow as 3.7 mm. Abnormal T2 signal within the cord emanating from this area consistent with compressive myelopathy. Bilateral foraminal stenosis could affect either C5 nerve. C5-6: Endplate osteophytes and protrusion of the disc. Mild posterior element  hypertrophy. Effacement of the subarachnoid space surrounding the cord with slight indentation of the cord. AP diameter of the  canal in the midline as narrow as 7.2 mm. Some T2 signal within the cord at this level, probably emanating from the C4-5 level but possibly with some contribution from the stenosis at this level. Bilateral foraminal narrowing could affect either C6 nerve. C6-7: Endplate osteophytes and bulging of the disc. Narrowing of the ventral subarachnoid space but no compression of the cord. Bilateral foraminal stenosis could affect either C7 nerve. C7-T1: Mild disc bulge. Bilateral facet osteoarthritis. No canal stenosis. Mild bilateral foraminal narrowing. T1-2: Bulging of the disc. No compressive canal stenosis. Mild bilateral foraminal narrowing. IMPRESSION: 1. 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 AP diameter of the canal in the midline as narrow as 3.7 mm. Cord compression with abnormal T2 signal within the cord emanating from this area consistent with compressive myelopathy. Effusions of the facet joints at this level suggest that anterolisthesis could occur at this level with flexion, worsening the stenosis. Neuro surgical consultation recommended. 2. Lesser spinal stenosis at C3-4 and C5-6, possibly with some degree of additional cord compression at the C5-6 level. 3. Bilateral foraminal stenosis from C3-4 through C6-7 that could cause neural compression. 4. These results will be called to the ordering clinician or representative by the Radiologist Assistant, and communication documented in the PACS or [employer]. Electronically Signed   By: Paulina Fusi M.D.   On: 03/20/2023 11:17   MR BRAIN WO CONTRAST  Result Date: 03/20/2023 CLINICAL DATA:  Neuro deficit, acute, stroke suspected. Progressive numbness and weakness. Sensory defect. EXAM: MRI HEAD WITHOUT CONTRAST TECHNIQUE: Multiplanar, multiecho pulse sequences of the brain and surrounding structures were obtained without intravenous contrast. COMPARISON:  No prior brain imaging. FINDINGS: Brain: Diffusion imaging does not show any acute or  subacute infarction or other cause of restricted diffusion. Minimal small vessel changes noted within the pons. No focal cerebellar insult. Cerebral hemispheres show age related volume loss with mild small vessel change of the deep white matter. No cortical or large vessel territory infarction. No finding to suggest demyelinating disease. No mass, hemorrhage, hydrocephalus or extra-axial collection. Few small foci of hemosiderin deposition associated with some of the old small vessel insults. Vascular: Major vessels at the base of the brain show flow. Skull and upper cervical spine: Negative Sinuses/Orbits: Mucosal inflammatory changes affecting the paranasal sinuses. Possible combination of mucosal inflammation, retention cysts and polyps. Orbits negative. Other: None IMPRESSION: 1. No acute or reversible finding. Mild age related volume loss. Minimal small vessel change of the pons and cerebral hemispheric white matter. 2. Mucosal inflammatory changes of the paranasal sinuses. Possible combination of mucosal inflammation, retention cysts and polyps. Electronically Signed   By: Paulina Fusi M.D.   On: 03/20/2023 11:10   DG Lumbar Spine 2-3 Views  Result Date: 03/20/2023 CLINICAL DATA:  [id number]. Status post insertion of spinal cord stimulator. EXAM: LUMBAR SPINE - 2-3 VIEW COMPARISON:  None Available. FINDINGS: There is a subcutaneously implanted power source in the right flank. Spinal cord stimulator leads extend from the power source and enter the spinal canal at T12-L1 coursing cephalad off of the plane of imaging at T11. There is osteopenia. The lumbar vertebra are normal in heights with no evidence of fractures. Mild lumbar spondylosis. There is moderate disc space loss at L5-S1 and mild disc narrowing L4-5. Normal disc heights above L4. Slight dextrorotary scoliosis apex L3. At L4 there is grade 1 anterolisthesis most likely  due to advanced facet hypertrophy at this level. There is otherwise normal  alignment. There is mild arthritic facet change at L3-4 and L5-S1. The SI joints are unremarkable, as visualized. The aorta and common iliac arteries are heavily calcified. IMPRESSION: 1. Osteopenia and degenerative change without evidence of fractures. 2. Spinal cord stimulator leads extend from the power source and enter the spinal canal at T12-L1 coursing cephalad off of the plane of imaging at T11. 3. Slight scoliosis, and grade 1 degenerative L4-5 spondylolisthesis. 4. Aortoiliac atherosclerosis. Electronically Signed   By: Almira Bar M.D.   On: 03/20/2023 07:05   DG Chest 2 View  Result Date: 03/19/2023 CLINICAL DATA:  Generalized weakness. EXAM: CHEST - 2 VIEW COMPARISON:  August 20, 2016. FINDINGS: Stable cardiomediastinal silhouette. Both lungs are clear. The visualized skeletal structures are unremarkable. IMPRESSION: No active cardiopulmonary disease. Electronically Signed   By: Lupita Raider M.D.   On: 03/19/2023 18:24    Assessment/Plan: Postop day 1 anterior cervical doing fairly well continue to mobilize with physical therapy continue to wean steroids  LOS: 2 days     Sean Austin 03/21/2023, 8:22 AM

## 2023-03-21 NOTE — Evaluation (Signed)
Occupational Therapy Evaluation Patient Details Name: Shawnmichael Anders MRN: [id number] DOB: 1951/02/10 Today's Date: 03/21/2023   History of Present Illness Pt is a 72 y.o. male admitted 8/6 after presenting to ED for evaluation of weakness, and numbness, reported has been progressing over the last few weeks, but has much worsened over the previous 2 to 3 days. 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 On 8/7, pt underwent anterior cervical discectomies and fusion at C4-5 C5-6 utilizing allograft spacers and the globus resonate plating system with anterior cervical plating. PMH: hypertension, EtOH use low back pain status post spinal cord stimulator placed in 2022.   Clinical Impression   At baseline, pt is Independent to Mod I with ADLs and IADLs and drives. At baseline pt performs functional mobility Independent, but states he often uses a cane that folds into a chair while in the community to have a place to rest due to frequent fatigue. Pt now presents with decreased activity tolerance, decreased sitting and standing balance during functional tasks, decreased B UE strength and fine motor coordination, impaired B UE sensation, decreased cognition, and decreased safety and independence with ADLs and functional transfers/mobility. Pt currently demonstrates ability to complete UB ADLs with Contact guard assist to Min assist, LB ADLs with Mod assist, and functional transfers with a RW with Min assist. Pt verbalizing understanding of training regarding cervical precautions but demonstrating poor carryover of training throughout session and often requiring verbal cues to follow precautions. Pt will benefit from acute skilled OT services to address deficits outlined below, decrease caregiver burden and increase safety and independence with ADLs, functional transfers, and functional mobility. Post  acute discharge, pt will benefit from continued skilled OT services in the home to maximize rehab potential.       If plan is discharge home, recommend the following: A little help with walking and/or transfers;A lot of help with bathing/dressing/bathroom;Assistance with cooking/housework;Direct supervision/assist for medications management;Direct supervision/assist for financial management;Assist for transportation;Help with stairs or ramp for entrance (Set up for self feeding)    Functional Status Assessment  Patient has had a recent decline in their functional status and demonstrates the ability to make significant improvements in function in a reasonable and predictable amount of time.  Equipment Recommendations  None recommended by OT    Recommendations for Other Services       Precautions / Restrictions Precautions Precautions: Cervical;Fall Restrictions Weight Bearing Restrictions: No      Mobility Bed Mobility Overal bed mobility: Needs Assistance Bed Mobility: Rolling, Supine to Sit, Sit to Supine Rolling: Supervision   Supine to sit: Supervision Sit to supine: Supervision   General bed mobility comments: Pt requires increased time and occasional cues for safety and improved technique    Transfers Overall transfer level: Needs assistance Equipment used: Rolling walker (2 wheels), 1 person hand held assist Transfers: Sit to/from Stand, Bed to chair/wheelchair/BSC Sit to Stand: Min assist, From elevated surface (hand held assist +1)     Step pivot transfers: Min assist     General transfer comment: Pt requires increased time and occasional cues for safety and hand placement/technique      Balance Overall balance assessment: Needs assistance Sitting-balance support: Single extremity supported, No upper extremity supported, Feet supported Sitting balance-Leahy Scale: Fair     Standing balance support: Single extremity supported, Bilateral upper extremity  supported, During functional activity, Reliant on assistive device  for balance Standing balance-Leahy Scale: Poor                             ADL either performed or assessed with clinical judgement   ADL Overall ADL's : Needs assistance/impaired Eating/Feeding: Minimal assistance;Sitting Eating/Feeding Details (indicate cue type and reason): Pt currently using two hands to hold cup as he reports droping heavier items held with one hand Grooming: Minimal assistance;Sitting   Upper Body Bathing: Minimal assistance;Cueing for compensatory techniques;Sitting   Lower Body Bathing: Moderate assistance;Cueing for compensatory techniques;Sitting/lateral leans;Sit to/from stand   Upper Body Dressing : Minimal assistance;Cueing for compensatory techniques;Sitting Upper Body Dressing Details (indicate cue type and reason): Largely Contact guard assist but requiring assistance with clothing fasteners Lower Body Dressing: Moderate assistance;Cueing for compensatory techniques;Sitting/lateral leans;Sit to/from stand   Toilet Transfer: Minimal assistance;Cueing for safety;BSC/3in1;Rolling walker (2 wheels);Cueing for sequencing (Step pivot)   Toileting- Clothing Manipulation and Hygiene: Moderate assistance;Cueing for compensatory techniques;Cueing for safety;Sitting/lateral lean;Sit to/from stand         General ADL Comments: Pt presenting with decreased activity tolerance during functional tasks.     Vision Baseline Vision/History: 1 Wears glasses Ability to See in Adequate Light: 0 Adequate Patient Visual Report: No change from baseline       Perception         Praxis         Pertinent Vitals/Pain Pain Assessment Pain Assessment: 0-10 Pain Score: 4  Pain Location: neck and across shoulders Pain Descriptors / Indicators: Aching, Discomfort Pain Intervention(s): Limited activity within patient's tolerance, Monitored during session, Repositioned     Extremity/Trunk  Assessment Upper Extremity Assessment Upper Extremity Assessment: Generalized weakness;Right hand dominant;RUE deficits/detail;LUE deficits/detail RUE Deficits / Details: generalized weakaness, decreased fine motor coordination, impaired sensation, impaired ROM in digit 5 of hand with pt reporting this present for approx. 2 years with pt reporting no known injury or cause. RUE Sensation: decreased light touch;decreased proprioception;history of peripheral neuropathy RUE Coordination: decreased fine motor LUE Deficits / Details: generalized weakaness, decreased fine motor coordination, impaired sensation LUE Sensation: decreased light touch;decreased proprioception;history of peripheral neuropathy LUE Coordination: decreased fine motor   Lower Extremity Assessment Lower Extremity Assessment: Defer to PT evaluation   Cervical / Trunk Assessment Cervical / Trunk Assessment: Neck Surgery (anterior cervical discectomies and fusion at C4-5 C5-6 utilizing allograft spacers and the globus resonate plating system with anterior cervical plating on 03/20/23)   Communication Communication Communication: No apparent difficulties Cueing Techniques: Verbal cues (Cues for safety, hand placement, and compensatory techniques)   Cognition Arousal: Alert Behavior During Therapy: WFL for tasks assessed/performed Overall Cognitive Status: Impaired/Different from baseline Area of Impairment: Safety/judgement, Awareness, Problem solving, Memory                     Memory: Decreased recall of precautions, Decreased short-term memory   Safety/Judgement: Decreased awareness of safety, Decreased awareness of deficits Awareness: Emergent Problem Solving: Slow processing, Difficulty sequencing, Requires verbal cues General Comments: Pt AAOx4 and pleasant throughout session. Pt demonstrates good insight into deficits. However, pt demonstrated poor carryover of training regarding cervical precations throughout  session, requiring frequent reminders.     General Comments  VSS on RA throughout session. OT provided education regarding cervical precautions. Howver, pt demonstrated poor carryover throughout session. Pt will benefit from reinforcement of education. Pt's wife present throughout session.    Exercises     Shoulder Instructions  Home Living Family/patient expects to be discharged to:: Private residence Living Arrangements: Spouse/significant other;Children (adult stepsons) Available Help at Discharge: Family;Available 24 hours/day;Friend(s) Type of Home: House Home Access: Stairs to enter [employer] of Steps: 4 Entrance Stairs-Rails: Left Home Layout: One level     Bathroom Shower/Tub: Producer, television/film/video: Handicapped height     Home Equipment: Shower seat;Grab bars - tub/shower;Hand held shower head;Other (comment);BSC/3in1;Rolling Walker (2 wheels) (Tripod cane that folds out into a seat; transport wheelchair)          Prior Functioning/Environment Prior Level of Function : Independent/Modified Independent             Mobility Comments: Independent in the house used can that folds ionto a chair in the community for rest ADLs Comments: Independent with ADLs and IADLs and drives        OT Problem List: Decreased strength;Decreased activity tolerance;Impaired balance (sitting and/or standing);Decreased coordination;Decreased knowledge of use of DME or AE;Decreased knowledge of precautions;Impaired sensation;Impaired UE functional use;Pain;Decreased cognition;Decreased safety awareness      OT Treatment/Interventions: Self-care/ADL training;Therapeutic exercise;Energy conservation;DME and/or AE instruction;Therapeutic activities;Patient/family education;Balance training    OT Goals(Current goals can be found in the care plan section) Acute Rehab OT Goals Patient Stated Goal: To return to his prior level of function and not have any pain or  numbness OT Goal Formulation: With patient/family Time For Goal Achievement: 04/04/23 Potential to Achieve Goals: Good ADL Goals Pt Will Perform Grooming: with supervision;standing Pt Will Perform Lower Body Bathing: with contact guard assist;with adaptive equipment;sitting/lateral leans;sit to/from stand Pt Will Perform Lower Body Dressing: with contact guard assist;with adaptive equipment;sitting/lateral leans;sit to/from stand Pt Will Transfer to Toilet: with supervision;ambulating;regular height toilet;grab bars (with least restrictive AD) Pt Will Perform Toileting - Clothing Manipulation and hygiene: sitting/lateral leans;sit to/from stand;with supervision Pt/caregiver will Perform Home Exercise Program: Both right and left upper extremity;With theraputty;With Supervision;With written HEP provided;Increased strength (AROM; Improved fine motor coordination; Improved activity tolerance) Additional ADL Goal #1: Patient will demonstrate ability to accurately set up a weekly medication planner with Mod I.  OT Frequency: Min 1X/week    Co-evaluation              AM-PAC OT "6 Clicks" Daily Activity     Outcome Measure Help from another person eating meals?: A Little Help from another person taking care of personal grooming?: A Little Help from another person toileting, which includes using toliet, bedpan, or urinal?: A Lot Help from another person bathing (including washing, rinsing, drying)?: A Lot Help from another person to put on and taking off regular upper body clothing?: A Little Help from another person to put on and taking off regular lower body clothing?: A Lot 6 Click Score: 15   End of Session Equipment Utilized During Treatment: Gait belt;Rolling walker (2 wheels) Nurse Communication: Mobility status  Activity Tolerance: Patient tolerated treatment well;Patient limited by fatigue Patient left: in bed;with call bell/phone within reach;with family/visitor present  OT  Visit Diagnosis: Unsteadiness on feet (R26.81);Other abnormalities of gait and mobility (R26.89);Muscle weakness (generalized) (M62.81);Ataxia, unspecified (R27.0)                Time: 4098-1191 OT Time Calculation (min): 39 min Charges:  OT General Charges $OT Visit: 1 Visit OT Evaluation $OT Eval Low Complexity: 1 Low OT Treatments $Self Care/Home Management : 8-22 mins   "Orson Eva., OTR/L, MA Acute Rehab [phone]   Lendon Colonel 03/21/2023, 5:58 PM

## 2023-03-21 NOTE — TOC Initial Note (Signed)
Transition of Care [facility]) - Initial/Assessment Note    Patient Details  Name: Sean Austin MRN: [id number] Date of Birth: 26-Jan-1951  Transition of Care [facility]) CM/SW Contact:    Mearl Latin, LCSW Phone Number: 03/21/2023, 11:45 AM  Clinical Narrative:                 CSW met with patient to address ETOH consult. Patient reported he lives at home with his wife and confirmed his PCP. He stated he has had ETOH use in the past but less so now and does not feel it is an issue for him. He declined community resources. TOC will follow for any therapy recommendations.     Barriers to Discharge: Continued Medical Work up   Patient Goals and CMS Choice Patient states their goals for this hospitalization and ongoing recovery are:: Return home          Expected Discharge Plan and Services In-house Referral: Clinical Social Work     Living arrangements for the past 2 months: Single Family Home                                      Prior Living Arrangements/Services Living arrangements for the past 2 months: Single Family Home Lives with:: Spouse Patient language and need for interpreter reviewed:: Yes        Need for Family Participation in Patient Care: No (Comment) Care giver support system in place?: Yes (comment)   Criminal Activity/Legal Involvement Pertinent to Current Situation/Hospitalization: No - Comment as needed  Activities of Daily Living Home Assistive Devices/Equipment: Cane (specify quad or straight) (cane with seat-used for seat/rest not balance) ADL Screening (condition at time of admission) Patient's cognitive ability adequate to safely complete daily activities?: Yes Is the patient deaf or have difficulty hearing?: No Does the patient have difficulty seeing, even when wearing glasses/contacts?: No Does the patient have difficulty concentrating, remembering, or making decisions?: No Patient able to express need for assistance with ADLs?: Yes Does the  patient have difficulty dressing or bathing?: Yes Independently performs ADLs?: Yes (appropriate for developmental age) Does the patient have difficulty walking or climbing stairs?: Yes Weakness of Legs: Both Weakness of Arms/Hands: Both  Permission Sought/Granted                  Emotional Assessment Appearance:: Appears stated age Attitude/Demeanor/Rapport: Engaged Affect (typically observed): Accepting, Appropriate, Pleasant Orientation: : Oriented to Self, Oriented to Place, Oriented to  Time, Oriented to Situation Alcohol / Substance Use: Alcohol Use Psych Involvement: No (comment)  Admission diagnosis:  Neck pain [M54.2] Weakness [R53.1] Weakness of distal arms and legs [R29.898] Bilateral numbness and tingling of arms and legs [R20.0, R20.2] Patient Active Problem List   Diagnosis Date Noted   Weakness 03/19/2023   Weakness of distal arms and legs 03/19/2023   Bilateral numbness and tingling of arms and legs 03/19/2023   Neck pain 03/19/2023   PCP:  Lupita Raider, MD Pharmacy:   [facility] [phone] - [place], [place] - [address] [place] [address] Phone: [phone] Fax: [phone]     Social Determinants of Health (SDOH) Social History: SDOH Screenings   Food Insecurity: No Food Insecurity (03/19/2023)  Housing: Low Risk  (03/19/2023)  Transportation Needs: No Transportation Needs (03/19/2023)  Utilities: Not At Risk (03/19/2023)  Financial Resource Strain: Low Risk  (06/13/2021)  Received from [facility], [facility]  Physical Activity: Inactive (06/13/2021)   Received from [facility], [facility]  Social Connections: Unknown (12/26/2021)   Received from [facility], [facility]  Stress: No Stress Concern Present (06/13/2021)   Received from [facility], [facility]  Tobacco Use: High Risk (03/20/2023)   SDOH Interventions:     Readmission Risk Interventions     No data to display

## 2023-03-22 DIAGNOSIS — G9520 Unspecified cord compression: Secondary | ICD-10-CM | POA: Diagnosis not present

## 2023-03-22 DIAGNOSIS — R531 Weakness: Secondary | ICD-10-CM | POA: Diagnosis not present

## 2023-03-22 MED ORDER — PANTOPRAZOLE SODIUM 40 MG PO TBEC
40.0000 mg | DELAYED_RELEASE_TABLET | Freq: Every day | ORAL | Status: DC
  Administered 2023-03-23: 40 mg via ORAL
  Filled 2023-03-22: qty 1

## 2023-03-22 MED ORDER — PREDNISONE 5 MG (21) PO TBPK: 5.0000 mg | ORAL_TABLET | Freq: Four times a day (QID) | ORAL | Status: DC

## 2023-03-22 MED ORDER — PREDNISONE 5 MG (21) PO TBPK
10.0000 mg | ORAL_TABLET | Freq: Every morning | ORAL | Status: AC
  Administered 2023-03-22: 10 mg via ORAL
  Filled 2023-03-22 (×2): qty 21

## 2023-03-22 MED ORDER — PREDNISONE 5 MG (21) PO TBPK
10.0000 mg | ORAL_TABLET | Freq: Every evening | ORAL | Status: AC
  Administered 2023-03-22: 10 mg via ORAL

## 2023-03-22 MED ORDER — PREDNISONE 5 MG (21) PO TBPK: 10.0000 mg | ORAL_TABLET | Freq: Every evening | ORAL | Status: DC

## 2023-03-22 MED ORDER — PREDNISONE 5 MG (21) PO TBPK
5.0000 mg | ORAL_TABLET | Freq: Three times a day (TID) | ORAL | Status: DC
  Administered 2023-03-23: 5 mg via ORAL

## 2023-03-22 MED ORDER — PREDNISONE 5 MG (21) PO TBPK
5.0000 mg | ORAL_TABLET | ORAL | Status: AC
  Administered 2023-03-22: 5 mg via ORAL

## 2023-03-22 NOTE — Care Management Important Message (Signed)
Important Message  Patient Details  Name: Sean Austin MRN: [id number] Date of Birth: 1950/08/18   Medicare Important Message Given:  Yes       03/22/2023, 4:20 PM

## 2023-03-22 NOTE — Progress Notes (Signed)
PROGRESS NOTE    Sean Austin  MWN:[id number] DOB: [birth year] DOA: 03/19/2023 PCP: Lupita Raider, MD   Chief Complaint  Patient presents with   Weakness    Brief Narrative:   Sean Austin is a 72 y.o. male with a known history of hypertension, EtOH use low back pain status post spinal cord stimulator placed in 2022 presents to the emergency department for evaluation of weakness, and numbness, reported has been progressing over the last few weeks, but has much worsened over the last 2 to 3 days which prompted him to come to ED.  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  -Initial labs including RPR, HIV, B12 and folic acid are nonconcerning -TSH mildly elevated, please see discussion below-follow-up zinc, copper, ceruloplasmin levels -MRI brain, and C-spine were obtained earlier today, no acute finding in MRI brain, but MRI cervical spine with concern of Severe multifactorial spinal stenosis at C4-5. AP diameter of the canal in the midline as narrow as 3.7 mm. Cord compression with abnormal T2 signal within the cord emanating from this area consistent with compressive myelopathy.  - Neurosnrgery greatly appreciated, status post Anterior cervical discectomies and fusion at C4-5 C5-6 utilizing allograft spacers and the globus resonate plating system with anterior cervical plating  -Will start steroid taper per neurosurgery recommendation -Hold on DVT prophylaxis for 72 hours postoperatively  History of hypertension -Continue diltiazem, olmesartan, aspirin  History of alcohol abuse -Monitor for withdrawal - CIWA protocol as needed -Vitamins  Bronchitis with mucous plugging -As evident on CT chest, he was encouraged to use incentive  spirometer and flutter valve.   History of nicotineuse disorder - Continue nicotine patch - Cessation advised   History of BPH - Continue tamsulosin   History of hyperlipidemia - Continue simvastatin  Weight loss -Patient reports 16 pounds weight loss over the last 24-month, CT chest/abdomen/pelvis with no concern for malignancy  Elevated TSH -TSH elevated at 8.7, free T4 within normal limit, no other clinical findings concerning for hypothyroidism, this is most likely due to sick euthyroid syndrome, will hold on initiating Synthroid for now.    DVT prophylaxis: SCDs for now defer pharmacological DVT prophylaxis for 72 hours postoperatively Code Status: Full Family Communication: none at bedside Disposition:   Status is: Inpatient    Consultants:  Neurology neurosurgery   Subjective:  No significant events overnight, he denies any complaints today, no worsening weakness  Objective: Vitals:   03/21/23 2308 03/22/23 0327 03/22/23 0749 03/22/23 1147  BP: (!) 141/75 (!) 158/81 (!) 152/77   Pulse:  61    Resp:   18   Temp: 97.7 F (36.5 C) (!) 97.4 F (36.3 C) 97.6 F (36.4 C)   TempSrc: Oral Oral Oral Oral  SpO2: 93% 94% 96% 98%  Weight:      Height:        Intake/Output Summary (Last 24 hours) at 03/22/2023 1201 Last data filed at 03/22/2023 0749 Gross per 24 hour  Intake 480 ml  Output 300 ml  Net 180 ml   Filed Weights   03/19/23 2252 03/20/23 1656 03/21/23 0308  Weight: 83.3 kg 83.3 kg 87.3 kg    Examination:  Awake Alert, Oriented X 3, No new F.N deficits, Normal affect Symmetrical Chest  wall movement, Good air movement bilaterally, CTAB RRR,No Gallops,Rubs or new Murmurs, No Parasternal Heave +ve B.Sounds, Abd Soft, No tenderness, No rebound - guarding or rigidity. No Cyanosis, Clubbing or edema, No new Rash or bruise      Data Reviewed: I have personally reviewed following labs and imaging studies  CBC: Recent Labs  Lab 03/19/23 1825  03/20/23 0021 03/21/23 0020 03/22/23 0145  WBC 6.4 6.2 7.4 10.0  NEUTROABS 4.0  --   --   --   HGB 13.0 11.5* 12.2* 11.5*  HCT 38.0* 33.7* 35.4* 32.8*  MCV 95.2 94.4 92.9 94.5  PLT 269 277 287 279    Basic Metabolic Panel: Recent Labs  Lab 03/19/23 1825 03/20/23 0021 03/21/23 0020 03/22/23 0145  NA 135 134* 131* 130*  K 3.6 3.5 4.2 4.0  CL 101 100 99 98  CO2 25 24 22 22   GLUCOSE 134* 105* 145* 152*  BUN 12 11 13 21   CREATININE 0.79 0.89 0.81 0.81  CALCIUM 9.0 8.9 8.9 8.9  MG 2.0  --   --   --     GFR: Estimated Creatinine Clearance: 94.5 mL/min (by C-G formula based on SCr of 0.81 mg/dL).  Liver Function Tests: Recent Labs  Lab 03/19/23 1825 03/20/23 0021  AST 22 18  ALT 15 15  ALKPHOS 73 62  BILITOT 0.6 0.3  PROT 6.8 6.0*  ALBUMIN 3.8 3.3*    CBG: Recent Labs  Lab 03/21/23 1118  GLUCAP 154*     Recent Results (from the past 240 hour(s))  MRSA Next Gen by PCR, Nasal     Status: None   Collection Time: 03/20/23 12:10 AM   Specimen: Nasal Mucosa; Nasal Swab  Result Value Ref Range Status   MRSA by PCR Next Gen NOT DETECTED NOT DETECTED Final    Comment: (NOTE) The GeneXpert MRSA Assay (FDA approved for NASAL specimens only), is one component of a comprehensive MRSA colonization surveillance program. It is not intended to diagnose MRSA infection nor to guide or monitor treatment for MRSA infections. Test performance is not FDA approved in patients less than 31 years old. Performed at [facility], [address], [place], [place] [address]          Radiology Studies: CT CHEST ABDOMEN PELVIS W CONTRAST  Result Date: 03/21/2023 CLINICAL DATA:  Progressive weakness, numbness, and weight loss for evaluation of underlying malignancy. * Tracking Code: BO * EXAM: CT CHEST, ABDOMEN, AND PELVIS WITH CONTRAST TECHNIQUE: Multidetector CT imaging of the chest, abdomen and pelvis was performed following the standard protocol during bolus  administration of intravenous contrast. RADIATION DOSE REDUCTION: This exam was performed according to the departmental dose-optimization program which includes automated exposure control, adjustment of the mA and/or kV according to patient size and/or use of iterative reconstruction technique. CONTRAST:  75mL OMNIPAQUE IOHEXOL 350 MG/ML SOLN COMPARISON:  None Available. FINDINGS: CT CHEST FINDINGS Cardiovascular: Normal heart size. No significant pericardial fluid/thickening. Great vessels are normal in course and caliber. No central pulmonary emboli. Coronary artery calcifications. Mediastinum/Nodes: Imaged thyroid gland without nodules meeting criteria for imaging follow-up by size. Normal esophagus. No pathologically enlarged axillary, supraclavicular, mediastinal, or hilar lymph nodes. Lungs/Pleura: The central airways are patent. Mild centrilobular and paraseptal emphysema. Mild diffuse bronchial wall thickening with partially occlusive subsegmental mucous plugging within the right lower lobe. Bilateral lower lobe subsegmental atelectasis/scarring with associated bronchiectasis. No pneumothorax. No pleural effusion. Musculoskeletal: Small bone cyst along the anterior cortex of right lateral eighth  rib (4:142). Minimally displaced lateral left tenth and nondisplaced eighth and ninth rib fractures. Multiple old posterior bilateral rib fractures. Anterior and right neck subcutaneous emphysema extending into the upper mediastinum, likely postsurgical from cervical spinal fusion. CT ABDOMEN PELVIS FINDINGS Hepatobiliary: 1.5 cm segment 8 hypodensity (3:64) likely cyst. No intra or extrahepatic biliary ductal dilation. Normal gallbladder. Pancreas: No focal lesions or main ductal dilation. Spleen: Normal in size without focal abnormality. Adrenals/Urinary Tract: No adrenal nodules. Multifocal right renal cortical scarring. No suspicious renal mass, calculi, or hydronephrosis. Bilateral simple/minimally complicated  cysts. No specific follow-up imaging recommended. No focal bladder wall thickening. Stomach/Bowel: Normal appearance of the stomach. Contrast-filled duodenal diverticulum arising from third portion. No evidence of bowel wall thickening, distention, or inflammatory changes. Colonic diverticulosis without acute diverticulitis. Normal appendix. Vascular/Lymphatic: Aortic atherosclerosis. No enlarged abdominal or pelvic lymph nodes. Reproductive: Prostate is unremarkable. Other: No free fluid, fluid collection, or free air. Musculoskeletal: No acute or abnormal lytic or blastic osseous findings. Spinal nerve stimulator generator within the right flank. Lead terminates the level of T9. IMPRESSION: 1. No evidence of malignancy in the chest, abdomen, or pelvis. 2. Minimally displaced lateral left tenth and nondisplaced eighth and ninth rib fractures. 3. Anterior and right neck subcutaneous emphysema extending into the upper mediastinum, likely postsurgical from cervical spinal fusion. 4. Mild diffuse bronchial wall thickening with partially occlusive subsegmental mucous plugging within the right lower lobe. 5. Aortic Atherosclerosis (ICD10-I70.0) and Emphysema (ICD10-J43.9). Coronary artery calcifications. Assessment for potential risk factor modification, dietary therapy or pharmacologic therapy may be warranted, if clinically indicated. Electronically Signed   By: Agustin Cree M.D.   On: 03/21/2023 16:11   DG Cervical Spine 1 View  Result Date: 03/20/2023 CLINICAL DATA:  Surgical anterior fusion of C4-5 and C5-6. EXAM: DG CERVICAL SPINE - 1 VIEW; DG C-ARM 1-60 MIN-NO REPORT Radiation exposure index: 0.58 mGy. COMPARISON:  None Available. FINDINGS: Two intraoperative fluoroscopic images were obtained of the cervical spine. First image demonstrates surgical localization of anterior portion of C5-6 disc space. Second image demonstrates the patient to be status post surgical anterior fusion of C4-5 and C5-6. IMPRESSION:  Fluoroscopic guidance provided during surgical anterior fusion of C4-5 and C5-6. Electronically Signed   By: Lupita Raider M.D.   On: 03/20/2023 19:53   DG C-Arm 1-60 Min-No Report  Result Date: 03/20/2023 Fluoroscopy was utilized by the requesting physician.  No radiographic interpretation.   DG C-Arm 1-60 Min-No Report  Result Date: 03/20/2023 Fluoroscopy was utilized by the requesting physician.  No radiographic interpretation.        Scheduled Meds:  aspirin  81 mg Oral Daily   atorvastatin  20 mg Oral Daily   diltiazem  180 mg Oral Daily   feeding supplement  237 mL Oral BID BM   folic acid  1 mg Oral Daily   irbesartan  300 mg Oral Daily   multivitamin with minerals  1 tablet Oral Daily   pantoprazole (PROTONIX) IV  40 mg Intravenous Q12H   predniSONE  10 mg Oral Nightly   [START ON 03/23/2023] predniSONE  10 mg Oral Nightly   predniSONE  5 mg Oral PC lunch   predniSONE  5 mg Oral PC supper   [START ON 03/23/2023] predniSONE  5 mg Oral 3 x daily with food   [START ON 03/24/2023] predniSONE  5 mg Oral 4X daily taper   sodium chloride flush  3 mL Intravenous Q12H   tamsulosin  0.8 mg Oral Daily  thiamine  100 mg Oral Daily   Or   thiamine  100 mg Intravenous Daily   Continuous Infusions:   LOS: 3 days      Huey Bienenstock, MD [facility]   To contact the attending provider between 7A-7P or the covering provider during after hours 7P-7A, please log into the web site [email or web address] and access using universal [place] password for that web site. If you do not have the password, please call the hospital operator.  03/22/2023, 12:01 PM

## 2023-03-22 NOTE — Plan of Care (Signed)
  Problem: Clinical Measurements: Goal: Will remain free from infection Outcome: Progressing Goal: Respiratory complications will improve Outcome: Progressing   Problem: Activity: Goal: Risk for activity intolerance will decrease Outcome: Progressing   Problem: Nutrition: Goal: Adequate nutrition will be maintained Outcome: Progressing   

## 2023-03-22 NOTE — Evaluation (Signed)
Physical Therapy Evaluation Patient Details Name: Sean Austin MRN: [id number] DOB: 29-Nov-1950 Today's Date: 03/22/2023  History of Present Illness  Pt is a 72 y.o. male admitted 8/6 after presenting to ED for evaluation of weakness, and numbness, reported has been progressing over the last few weeks, but has much worsened over the previous 2 to 3 days. 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 On 8/7, pt underwent anterior cervical discectomies and fusion at C4-5 C5-6 utilizing allograft spacers and the globus resonate plating system with anterior cervical plating. PMH: hypertension, EtOH use low back pain status post spinal cord stimulator placed in 2022.  Clinical Impression  Pt is presenting below baseline level of functioning. Prior to hospitalization pt was independent for all functional mobility. Currently pt is supervision to CGA for all functional mobility with crouched standing/gait with fatigue for distances over 50 ft with an AD. Pt has family that lives at home with him and is able to assist. Due to pt current functional status, home set up and available assistance at home recommending skilled physical therapy services 3x/weekly on discharge from acute care hospital setting in order to decrease risk for falls, injury, immobility and re-hospitalization. Pt tolerated treatment session well.         If plan is discharge home, recommend the following: A little help with walking and/or transfers;Assist for transportation;Help with stairs or ramp for entrance;Assistance with cooking/housework     Equipment Recommendations None recommended by PT     Functional Status Assessment Patient has had a recent decline in their functional status and demonstrates the ability to make significant improvements in function in a reasonable and predictable amount of time.     Precautions /  Restrictions Precautions Precautions: Cervical;Fall Restrictions Weight Bearing Restrictions: No      Mobility  Bed Mobility Overal bed mobility: Modified Independent Bed Mobility: Supine to Sit, Sit to Supine Rolling: Modified independent (Device/Increase time)   Supine to sit: Modified independent (Device/Increase time) Sit to supine: Modified independent (Device/Increase time)     Patient Response: Cooperative  Transfers Overall transfer level: Needs assistance Equipment used: Rolling walker (2 wheels) Transfers: Sit to/from Stand Sit to Stand: Supervision           General transfer comment: Pt requires increased time and occasional cues for safety and hand placement/technique to maintain cervical precautions    Ambulation/Gait Ambulation/Gait assistance: Contact guard assist Gait Distance (Feet): 250 Feet Assistive device: Rolling walker (2 wheels) Gait Pattern/deviations: Step-through pattern, Decreased step length - right, Decreased step length - left, Knee flexed in stance - left, Knee flexed in stance - right, Narrow base of support Gait velocity: decreased cadence. Gait velocity interpretation: <1.31 ft/sec, indicative of household ambulator   General Gait Details: Not full step through gait pattern. short steps with narrow BOS and as pt gait progressed pt has increased bil knee flexion.  Stairs Stairs:  (Pt performed SLS and small SL squat to demonstrate ability to navigate stairs per home set up. Educated on the proper positioning of family when they are assisting with stair navigation.)            Tilt Bed Tilt Bed Patient Response: Cooperative       Balance Overall balance assessment: Mild deficits observed, not formally tested   Sitting balance-Leahy Scale: Normal       Standing balance-Leahy Scale: Fair Standing balance comment: no  overt LOB, requires UE support         Pertinent Vitals/Pain Pain Assessment Pain Assessment:  0-10 Pain Score: 4  Pain Location: neck and across shoulders Pain Descriptors / Indicators: Aching, Discomfort Pain Intervention(s): Limited activity within patient's tolerance, Monitored during session    Home Living Family/patient expects to be discharged to:: Private residence Living Arrangements: Spouse/significant other;Children (adult stepsons) Available Help at Discharge: Family;Available 24 hours/day;Friend(s) Type of Home: House Home Access: Stairs to enter Entrance Stairs-Rails: Left Entrance Stairs-Number of Steps: 4   Home Layout: One level Home Equipment: Shower seat;Grab bars - tub/shower;Hand held shower head;Other (comment);BSC/3in1;Rolling Walker (2 wheels)      Prior Function Prior Level of Function : Independent/Modified Independent             Mobility Comments: Independent in the house. Outside of the home has a chair that is like a cane he uses for rest. ADLs Comments: Independent with ADLs and IADLs and drives     Extremity/Trunk Assessment   Upper Extremity Assessment Upper Extremity Assessment: Defer to OT evaluation    Lower Extremity Assessment Lower Extremity Assessment: RLE deficits/detail;LLE deficits/detail RLE Sensation: decreased light touch;decreased proprioception RLE Coordination: decreased gross motor LLE Sensation: decreased light touch;decreased proprioception LLE Coordination: decreased gross motor    Cervical / Trunk Assessment Cervical / Trunk Assessment: Neck Surgery  Communication      Cognition Arousal: Alert Behavior During Therapy: WFL for tasks assessed/performed Overall Cognitive Status: Within Functional Limits for tasks assessed        General Comments General comments (skin integrity, edema, etc.): Re-iterated education on spinal precautions. Pt able to remember 2/3 by end of session.        Assessment/Plan    PT Assessment Patient needs continued PT services  PT Problem List Decreased  strength;Decreased mobility;Decreased safety awareness;Decreased activity tolerance;Decreased balance       PT Treatment Interventions DME instruction;Therapeutic exercise;Gait training;Balance training;Stair training;Neuromuscular re-education;Functional mobility training;Cognitive remediation;Therapeutic activities;Patient/family education    PT Goals (Current goals can be found in the Care Plan section)  Acute Rehab PT Goals Patient Stated Goal: To go home and eventually go to OPPT PT Goal Formulation: With patient Time For Goal Achievement: 04/05/23 Potential to Achieve Goals: Good    Frequency Min 1X/week        AM-PAC PT "6 Clicks" Mobility  Outcome Measure Help needed turning from your back to your side while in a flat bed without using bedrails?: None Help needed moving from lying on your back to sitting on the side of a flat bed without using bedrails?: None Help needed moving to and from a bed to a chair (including a wheelchair)?: A Little Help needed standing up from a chair using your arms (e.g., wheelchair or bedside chair)?: A Little Help needed to walk in hospital room?: A Little Help needed climbing 3-5 steps with a railing? : A Little 6 Click Score: 20    End of Session Equipment Utilized During Treatment: Gait belt Activity Tolerance: Patient tolerated treatment well Patient left: in bed;with call bell/phone within reach Nurse Communication: Mobility status PT Visit Diagnosis: Unsteadiness on feet (R26.81);Other abnormalities of gait and mobility (R26.89)    Time: 2952-8413 PT Time Calculation (min) (ACUTE ONLY): 18 min   Charges:   PT Evaluation $PT Eval Low Complexity: 1 Low   PT General Charges $$ ACUTE PT VISIT: 1 Visit        Harrel Carina, DPT, CLT  Acute Rehabilitation Services Office: [phone] (Secure  chat preferred)   Claudia Desanctis 03/22/2023, 1:22 PM

## 2023-03-22 NOTE — TOC Progression Note (Signed)
Transition of Care [facility]) - Progression Note    Patient Details  Name: Sean Austin MRN: [id number] Date of Birth: 1951-05-08  [facility]) CM/SW Contact  Gordy Clement, RN Phone Number: 03/22/2023, 1:51 PM  Clinical Narrative:     Patient has been recommended Home Health PT and OT  [facility] has accepted. No DME is recommended.   TOC will continue to follow patient for any additional discharge needs        Barriers to Discharge: Continued Medical Work up  Expected Discharge Plan and Services In-house Referral: Clinical Social Work     Living arrangements for the past 2 months: Single Family Home                                       Social Determinants of Health (SDOH) Interventions SDOH Screenings   Food Insecurity: No Food Insecurity (03/19/2023)  Housing: Low Risk  (03/19/2023)  Transportation Needs: No Transportation Needs (03/19/2023)  Utilities: Not At Risk (03/19/2023)  Financial Resource Strain: Low Risk  (06/13/2021)   Received from [facility], [facility]  Physical Activity: Inactive (06/13/2021)   Received from [facility], [facility]  Social Connections: Unknown (12/26/2021)   Received from [facility], [facility]  Stress: No Stress Concern Present (06/13/2021)   Received from [facility], [facility]  Tobacco Use: High Risk (03/20/2023)    Readmission Risk Interventions     No data to display

## 2023-03-23 DIAGNOSIS — R531 Weakness: Secondary | ICD-10-CM | POA: Diagnosis not present

## 2023-03-23 DIAGNOSIS — G9520 Unspecified cord compression: Secondary | ICD-10-CM | POA: Diagnosis not present

## 2023-03-23 DIAGNOSIS — G959 Disease of spinal cord, unspecified: Secondary | ICD-10-CM | POA: Insufficient documentation

## 2023-03-23 MED ORDER — THIAMINE HCL 100 MG PO TABS: 100.0000 mg | ORAL_TABLET | Freq: Every day | ORAL | Status: AC

## 2023-03-23 MED ORDER — PANTOPRAZOLE SODIUM 40 MG PO TBEC: 40.0000 mg | DELAYED_RELEASE_TABLET | Freq: Every day | ORAL | 0 refills | Status: AC

## 2023-03-23 MED ORDER — HYDROCODONE-ACETAMINOPHEN 5-325 MG PO TABS: 1.0000 | ORAL_TABLET | Freq: Four times a day (QID) | ORAL | 0 refills | Status: DC | PRN

## 2023-03-23 MED ORDER — METHYLPREDNISOLONE 4 MG PO TBPK: ORAL_TABLET | ORAL | 0 refills | Status: DC

## 2023-03-23 MED ORDER — ADULT MULTIVITAMIN W/MINERALS CH: 1.0000 | ORAL_TABLET | Freq: Every day | ORAL | Status: AC

## 2023-03-23 NOTE — Plan of Care (Signed)

## 2023-03-23 NOTE — Discharge Instructions (Signed)
Follow with Primary MD Lupita Raider, MD in 7 days   Get CBC, CMP, checked  by Primary MD next visit.    Activity: As tolerated with Full fall precautions use walker/cane & assistance as needed   Disposition Home    Diet: Regular Diet  On your next visit with your primary care physician please Get Medicines reviewed and adjusted.   Please request your Prim.MD to go over all Hospital Tests and Procedure/Radiological results at the follow up, please get all Hospital records sent to your Prim MD by signing hospital release before you go home.   If you experience worsening of your admission symptoms, develop shortness of breath, life threatening emergency, suicidal or homicidal thoughts you must seek medical attention immediately by calling 911 or calling your MD immediately  if symptoms less severe.  You Must read complete instructions/literature along with all the possible adverse reactions/side effects for all the Medicines you take and that have been prescribed to you. Take any new Medicines after you have completely understood and accpet all the possible adverse reactions/side effects.   Do not drive, operating heavy machinery, perform activities at heights, swimming or participation in water activities or provide baby sitting services if your were admitted for syncope or siezures until you have seen by Primary MD or a Neurologist and advised to do so again.  Do not drive when taking Pain medications.    Do not take more than prescribed Pain, Sleep and Anxiety Medications  Special Instructions: If you have smoked or chewed Tobacco  in the last 2 yrs please stop smoking, stop any regular Alcohol  and or any Recreational drug use.  Wear Seat belts while driving.   Please note  You were cared for by a hospitalist during your hospital stay. If you have any questions about your discharge medications or the care you received while you were in the hospital after you are discharged,  you can call the unit and asked to speak with the hospitalist on call if the hospitalist that took care of you is not available. Once you are discharged, your primary care physician will handle any further medical issues. Please note that NO REFILLS for any discharge medications will be authorized once you are discharged, as it is imperative that you return to your primary care physician (or establish a relationship with a primary care physician if you do not have one) for your aftercare needs so that they can reassess your need for medications and monitor your lab values.

## 2023-03-23 NOTE — Progress Notes (Signed)
   Providing Compassionate, Quality Care - Together  NEUROSURGERY PROGRESS NOTE   S: No issues overnight. Going home this am  O: EXAM:  BP (!) 178/80 (BP Location: Left Arm)   Pulse 70   Temp 97.8 F (36.6 C) (Oral)   Resp 18   Ht 6\' 1"  (1.854 m)   Wt 87.3 kg   SpO2 98%   BMI 25.39 kg/m   Awake, alert, oriented x3 PERRL Speech fluent, appropriate  CNs grossly intact  Mae well  ASSESSMENT:  72 y.o. male with   Status post C4-6 ACDF  PLAN: -DC home, overall doing well    Thank you for allowing me to participate in this patient's care.  Please do not hesitate to call with questions or concerns.   Monia Pouch, DO Neurosurgeon [facility] Cell: [phone]

## 2023-03-23 NOTE — TOC Transition Note (Signed)
Transition of Care [facility]) - CM/SW Discharge Note   Patient Details  Name: Sean Austin MRN: [id number] Date of Birth: 10-03-1950  Transition of Care [facility]) CM/SW Contact:  Lawerance Sabal, RN Phone Number: 03/23/2023, 8:37 AM   Clinical Narrative:     Notified HH agency of pla to DC today.  No DME needs, no there needs identified at this time for DC   Final next level of care: Home w Home Health Services Barriers to Discharge: No Barriers Identified   Patient Goals and CMS Choice      Discharge Placement                         Discharge Plan and Services Additional resources added to the After Visit Summary for   In-house Referral: Clinical Social Work              DME Arranged: N/A         HH Arranged: PT, OT HH Agency: [facility] Date HH Agency Contacted: 03/23/23 Time HH Agency Contacted: [phone] Representative spoke with at [facility] Agency: Angie  Social Determinants of Health (SDOH) Interventions SDOH Screenings   Food Insecurity: No Food Insecurity (03/19/2023)  Housing: Low Risk  (03/19/2023)  Transportation Needs: No Transportation Needs (03/19/2023)  Utilities: Not At Risk (03/19/2023)  Financial Resource Strain: Low Risk  (06/13/2021)   Received from [facility], [facility]  Physical Activity: Inactive (06/13/2021)   Received from [facility], [facility]  Social Connections: Unknown (12/26/2021)   Received from [facility], [facility]  Stress: No Stress Concern Present (06/13/2021)   Received from [facility], [facility]  Tobacco Use: High Risk (03/20/2023)     Readmission Risk Interventions     No data to display

## 2023-03-23 NOTE — Discharge Summary (Signed)
Physician Discharge Summary  Sean Austin PXT:[id number] DOB: 04/27/1951 DOA: 03/19/2023  PCP: Lupita Raider, MD  Admit date: 03/19/2023 Discharge date: 03/23/2023  Admitted From: (Home) Disposition:  (Home )  Recommendations for Outpatient Follow-up:  Follow up with PCP in 1-2 weeks Please obtain BMP/CBC in one week Recheck TSH and free T4 in 6 weeks  Home Health: (YES)  Diet recommendation:  Regular  Brief/Interim Summary:  Sean Austin is a 72 y.o. male with a known history of hypertension, EtOH use low back pain status post spinal cord stimulator placed in 2022 presents to the emergency department for evaluation of weakness, and numbness, reported has been progressing over the last few weeks, but has much worsened over the last 2 to 3 days which prompted him to come to ED. MRI brain with no acute findings, but MRI of cervical spine significant for severe multifactorial spinal stenosis, with cord compression, please see discussion below.  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  -Initial labs including RPR, HIV, B12 and folic acid are nonconcerning -TSH mildly elevated, please see discussion below-follow-up zinc, copper, ceruloplasmin levels -MRI brain, and C-spine were obtained earlier today, no acute finding in MRI brain, but MRI cervical spine with concern of Severe multifactorial spinal stenosis at C4-5. AP diameter of the canal in the midline as narrow as 3.7 mm. Cord compression with abnormal T2 signal within the cord emanating from this area consistent with compressive myelopathy.  - Neurosnrgery greatly appreciated, status post Anterior cervical discectomies and fusion at C4-5 C5-6 utilizing allograft spacers and the globus resonate plating system with anterior cervical plating  -Will start steroid taper per neurosurgery recommendation -Hold on DVT prophylaxis for 72 hours postoperatively   History of  hypertension -Continue diltiazem, olmesartan, aspirin   History of alcohol abuse Was treated with CIWA protocol during hospital stay, thiamine, folic acid, no evidence of withdrawals    Bronchitis with mucous plugging -As evident on CT chest, he was encouraged to use incentive spirometer and flutter valve.  To keep using incentive and flutter at home   History of nicotineuse disorder - Continue nicotine patch - Cessation advised   History of BPH - Continue tamsulosin   History of hyperlipidemia - Continue simvastatin   Weight loss -Patient reports 16 pounds weight loss over the last 65-month, CT chest/abdomen/pelvis with no concern for malignancy   Elevated TSH -TSH elevated at 8.7, free T4 within normal limit, no other clinical findings concerning for hypothyroidism, this is most likely due to sick euthyroid syndrome, will hold on initiating Synthroid for now.     Discharge Diagnoses:  Principal Problem:   Weakness Active Problems:   Weakness of distal arms and legs   Bilateral numbness and tingling of arms and legs   Neck pain   Cervical myelopathy (HCC)   Cord compression [facility])    Discharge Instructions  Discharge Instructions     Diet - low sodium heart healthy   Complete by: As directed    Discharge instructions   Complete by: As directed    Follow with Primary MD Lupita Raider, MD in 7 days   Get CBC, CMP, checked  by Primary MD next visit.    Activity: As tolerated with Full fall precautions use walker/cane & assistance as needed   Disposition Home    Diet: Regular Diet  On your next visit with your primary care physician please Get Medicines reviewed and adjusted.   Please request your Prim.MD  to go over all Hospital Tests and Procedure/Radiological results at the follow up, please get all Hospital records sent to your Prim MD by signing hospital release before you go home.   If you experience worsening of your admission symptoms, develop  shortness of breath, life threatening emergency, suicidal or homicidal thoughts you must seek medical attention immediately by calling 911 or calling your MD immediately  if symptoms less severe.  You Must read complete instructions/literature along with all the possible adverse reactions/side effects for all the Medicines you take and that have been prescribed to you. Take any new Medicines after you have completely understood and accpet all the possible adverse reactions/side effects.   Do not drive, operating heavy machinery, perform activities at heights, swimming or participation in water activities or provide baby sitting services if your were admitted for syncope or siezures until you have seen by Primary MD or a Neurologist and advised to do so again.  Do not drive when taking Pain medications.    Do not take more than prescribed Pain, Sleep and Anxiety Medications  Special Instructions: If you have smoked or chewed Tobacco  in the last 2 yrs please stop smoking, stop any regular Alcohol  and or any Recreational drug use.  Wear Seat belts while driving.   Please note  You were cared for by a hospitalist during your hospital stay. If you have any questions about your discharge medications or the care you received while you were in the hospital after you are discharged, you can call the unit and asked to speak with the hospitalist on call if the hospitalist that took care of you is not available. Once you are discharged, your primary care physician will handle any further medical issues. Please note that NO REFILLS for any discharge medications will be authorized once you are discharged, as it is imperative that you return to your primary care physician (or establish a relationship with a primary care physician if you do not have one) for your aftercare needs so that they can reassess your need for medications and monitor your lab values.   Increase activity slowly   Complete by: As  directed       Allergies as of 03/23/2023       Reactions   Amlodipine Swelling   Ace Inhibitors Cough        Medication List     TAKE these medications    aspirin 81 MG chewable tablet Chew 81 mg by mouth daily.   diltiazem 180 MG 24 hr capsule Commonly known as: DILACOR XR Take 180 mg by mouth daily.   folic acid 400 MCG tablet Commonly known as: FOLVITE Take 400 mcg by mouth daily.   HYDROcodone-acetaminophen 5-325 MG tablet Commonly known as: NORCO/VICODIN Take 1 tablet by mouth every 6 (six) hours as needed for moderate pain.   KP Omega-3 Fish Oil 1200 MG Cpdr Take 1 capsule by mouth daily.   methylPREDNISolone 4 MG Tbpk tablet Commonly known as: MEDROL DOSEPAK follow package directions Notes to patient: Finish pack taken home from the hospital. Follow instructions on the pack.   multivitamin with minerals Tabs tablet Take 1 tablet by mouth daily. Start taking on: March 24, 2023   olmesartan 40 MG tablet Commonly known as: BENICAR Take 40 mg by mouth daily.   pantoprazole 40 MG tablet Commonly known as: Protonix Take 1 tablet (40 mg total) by mouth daily.   SAW PALMETTO PO Take 1 capsule by mouth daily. 320  mg.   simvastatin 40 MG tablet Commonly known as: ZOCOR Take 40 mg by mouth daily.   tamsulosin 0.4 MG Caps capsule Commonly known as: FLOMAX Take 0.8 mg by mouth daily.   thiamine 100 MG tablet Commonly known as: VITAMIN B1 Take 1 tablet (100 mg total) by mouth daily.   tiZANidine 4 MG tablet Commonly known as: ZANAFLEX Take 4 mg by mouth at bedtime as needed for muscle spasms.   TURMERIC PO Take 500 mg by mouth every evening.        Follow-up Information     [facility] Follow up.   Specialty: Home Health Services Why: home health services Contact information: [address] [place] [address] [phone]         Donalee Citrin, MD Follow up in 1 week(s).   Specialty:  Neurosurgery Contact information: [address] [place] [address] [phone]                Allergies  Allergen Reactions   Amlodipine Swelling   Ace Inhibitors Cough    Consultations: Neurosurgery  Procedures/Studies: CT CHEST ABDOMEN PELVIS W CONTRAST  Result Date: 03/21/2023 CLINICAL DATA:  Progressive weakness, numbness, and weight loss for evaluation of underlying malignancy. * Tracking Code: BO * EXAM: CT CHEST, ABDOMEN, AND PELVIS WITH CONTRAST TECHNIQUE: Multidetector CT imaging of the chest, abdomen and pelvis was performed following the standard protocol during bolus administration of intravenous contrast. RADIATION DOSE REDUCTION: This exam was performed according to the departmental dose-optimization program which includes automated exposure control, adjustment of the mA and/or kV according to patient size and/or use of iterative reconstruction technique. CONTRAST:  75mL OMNIPAQUE IOHEXOL 350 MG/ML SOLN COMPARISON:  None Available. FINDINGS: CT CHEST FINDINGS Cardiovascular: Normal heart size. No significant pericardial fluid/thickening. Great vessels are normal in course and caliber. No central pulmonary emboli. Coronary artery calcifications. Mediastinum/Nodes: Imaged thyroid gland without nodules meeting criteria for imaging follow-up by size. Normal esophagus. No pathologically enlarged axillary, supraclavicular, mediastinal, or hilar lymph nodes. Lungs/Pleura: The central airways are patent. Mild centrilobular and paraseptal emphysema. Mild diffuse bronchial wall thickening with partially occlusive subsegmental mucous plugging within the right lower lobe. Bilateral lower lobe subsegmental atelectasis/scarring with associated bronchiectasis. No pneumothorax. No pleural effusion. Musculoskeletal: Small bone cyst along the anterior cortex of right lateral eighth rib (4:142). Minimally displaced lateral left tenth and nondisplaced eighth and ninth rib  fractures. Multiple old posterior bilateral rib fractures. Anterior and right neck subcutaneous emphysema extending into the upper mediastinum, likely postsurgical from cervical spinal fusion. CT ABDOMEN PELVIS FINDINGS Hepatobiliary: 1.5 cm segment 8 hypodensity (3:64) likely cyst. No intra or extrahepatic biliary ductal dilation. Normal gallbladder. Pancreas: No focal lesions or main ductal dilation. Spleen: Normal in size without focal abnormality. Adrenals/Urinary Tract: No adrenal nodules. Multifocal right renal cortical scarring. No suspicious renal mass, calculi, or hydronephrosis. Bilateral simple/minimally complicated cysts. No specific follow-up imaging recommended. No focal bladder wall thickening. Stomach/Bowel: Normal appearance of the stomach. Contrast-filled duodenal diverticulum arising from third portion. No evidence of bowel wall thickening, distention, or inflammatory changes. Colonic diverticulosis without acute diverticulitis. Normal appendix. Vascular/Lymphatic: Aortic atherosclerosis. No enlarged abdominal or pelvic lymph nodes. Reproductive: Prostate is unremarkable. Other: No free fluid, fluid collection, or free air. Musculoskeletal: No acute or abnormal lytic or blastic osseous findings. Spinal nerve stimulator generator within the right flank. Lead terminates the level of T9. IMPRESSION: 1. No evidence of malignancy in the chest, abdomen, or pelvis.  2. Minimally displaced lateral left tenth and nondisplaced eighth and ninth rib fractures. 3. Anterior and right neck subcutaneous emphysema extending into the upper mediastinum, likely postsurgical from cervical spinal fusion. 4. Mild diffuse bronchial wall thickening with partially occlusive subsegmental mucous plugging within the right lower lobe. 5. Aortic Atherosclerosis (ICD10-I70.0) and Emphysema (ICD10-J43.9). Coronary artery calcifications. Assessment for potential risk factor modification, dietary therapy or pharmacologic therapy may  be warranted, if clinically indicated. Electronically Signed   By: Agustin Cree M.D.   On: 03/21/2023 16:11   DG Cervical Spine 1 View  Result Date: 03/20/2023 CLINICAL DATA:  Surgical anterior fusion of C4-5 and C5-6. EXAM: DG CERVICAL SPINE - 1 VIEW; DG C-ARM 1-60 MIN-NO REPORT Radiation exposure index: 0.58 mGy. COMPARISON:  None Available. FINDINGS: Two intraoperative fluoroscopic images were obtained of the cervical spine. First image demonstrates surgical localization of anterior portion of C5-6 disc space. Second image demonstrates the patient to be status post surgical anterior fusion of C4-5 and C5-6. IMPRESSION: Fluoroscopic guidance provided during surgical anterior fusion of C4-5 and C5-6. Electronically Signed   By: Lupita Raider M.D.   On: 03/20/2023 19:53   DG C-Arm 1-60 Min-No Report  Result Date: 03/20/2023 Fluoroscopy was utilized by the requesting physician.  No radiographic interpretation.   DG C-Arm 1-60 Min-No Report  Result Date: 03/20/2023 Fluoroscopy was utilized by the requesting physician.  No radiographic interpretation.   MR CERVICAL SPINE WO CONTRAST  Result Date: 03/20/2023 CLINICAL DATA:  Progressive numbness and weakness. Sensor E disturbance of all 4 extremities. EXAM: MRI CERVICAL SPINE WITHOUT CONTRAST TECHNIQUE: Multiplanar, multisequence MR imaging of the cervical spine was performed. No intravenous contrast was administered. COMPARISON:  No prior cervical imaging. FINDINGS: Alignment: Straightening of the normal cervical lordosis. Vertebrae: No fracture. Joint effusions of the facet joints at C4-5 suggesting that anterolisthesis could occur at this level with flexion. 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 See below. Posterior Fossa, vertebral arteries, paraspinal tissues: Negative Disc levels: The foramen magnum is widely patent. There is ordinary mild osteoarthritis of the C1-2 articulation but  no encroachment upon the neural structures. C2-3: Unremarkable interspace. C3-4: Endplate osteophytes and shallow protrusion of the disc. Bilateral facet osteoarthritis. Canal narrowing with narrowing of the subarachnoid space surrounding the cord but no cord compression or deformity at this level. Bilateral foraminal stenosis that could affect either C4 nerve. C4-5: Broad-based disc herniation. Bilateral facet arthropathy with hypertrophic change. Effusions in the facet joints suggest that anterolisthesis could occur at this level with flexion, worsening the stenosis. AP diameter of the canal in the midline as narrow as 3.7 mm. Abnormal T2 signal within the cord emanating from this area consistent with compressive myelopathy. Bilateral foraminal stenosis could affect either C5 nerve. C5-6: Endplate osteophytes and protrusion of the disc. Mild posterior element hypertrophy. Effacement of the subarachnoid space surrounding the cord with slight indentation of the cord. AP diameter of the canal in the midline as narrow as 7.2 mm. Some T2 signal within the cord at this level, probably emanating from the C4-5 level but possibly with some contribution from the stenosis at this level. Bilateral foraminal narrowing could affect either C6 nerve. C6-7: Endplate osteophytes and bulging of the disc. Narrowing of the ventral subarachnoid space but no compression of the cord. Bilateral foraminal stenosis could affect either C7 nerve. C7-T1: Mild disc bulge. Bilateral facet osteoarthritis. No canal stenosis. Mild bilateral foraminal narrowing. T1-2:  Bulging of the disc. No compressive canal stenosis. Mild bilateral foraminal narrowing. IMPRESSION: 1. 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 AP diameter of the canal in the midline as narrow as 3.7 mm. Cord compression with abnormal T2 signal within the cord emanating from this area consistent with compressive myelopathy. Effusions of the facet joints at this level suggest  that anterolisthesis could occur at this level with flexion, worsening the stenosis. Neuro surgical consultation recommended. 2. Lesser spinal stenosis at C3-4 and C5-6, possibly with some degree of additional cord compression at the C5-6 level. 3. Bilateral foraminal stenosis from C3-4 through C6-7 that could cause neural compression. 4. These results will be called to the ordering clinician or representative by the Radiologist Assistant, and communication documented in the PACS or [employer]. Electronically Signed   By: Paulina Fusi M.D.   On: 03/20/2023 11:17   MR BRAIN WO CONTRAST  Result Date: 03/20/2023 CLINICAL DATA:  Neuro deficit, acute, stroke suspected. Progressive numbness and weakness. Sensory defect. EXAM: MRI HEAD WITHOUT CONTRAST TECHNIQUE: Multiplanar, multiecho pulse sequences of the brain and surrounding structures were obtained without intravenous contrast. COMPARISON:  No prior brain imaging. FINDINGS: Brain: Diffusion imaging does not show any acute or subacute infarction or other cause of restricted diffusion. Minimal small vessel changes noted within the pons. No focal cerebellar insult. Cerebral hemispheres show age related volume loss with mild small vessel change of the deep white matter. No cortical or large vessel territory infarction. No finding to suggest demyelinating disease. No mass, hemorrhage, hydrocephalus or extra-axial collection. Few small foci of hemosiderin deposition associated with some of the old small vessel insults. Vascular: Major vessels at the base of the brain show flow. Skull and upper cervical spine: Negative Sinuses/Orbits: Mucosal inflammatory changes affecting the paranasal sinuses. Possible combination of mucosal inflammation, retention cysts and polyps. Orbits negative. Other: None IMPRESSION: 1. No acute or reversible finding. Mild age related volume loss. Minimal small vessel change of the pons and cerebral hemispheric white matter. 2. Mucosal  inflammatory changes of the paranasal sinuses. Possible combination of mucosal inflammation, retention cysts and polyps. Electronically Signed   By: Paulina Fusi M.D.   On: 03/20/2023 11:10   DG Lumbar Spine 2-3 Views  Result Date: 03/20/2023 CLINICAL DATA:  [id number]. Status post insertion of spinal cord stimulator. EXAM: LUMBAR SPINE - 2-3 VIEW COMPARISON:  None Available. FINDINGS: There is a subcutaneously implanted power source in the right flank. Spinal cord stimulator leads extend from the power source and enter the spinal canal at T12-L1 coursing cephalad off of the plane of imaging at T11. There is osteopenia. The lumbar vertebra are normal in heights with no evidence of fractures. Mild lumbar spondylosis. There is moderate disc space loss at L5-S1 and mild disc narrowing L4-5. Normal disc heights above L4. Slight dextrorotary scoliosis apex L3. At L4 there is grade 1 anterolisthesis most likely due to advanced facet hypertrophy at this level. There is otherwise normal alignment. There is mild arthritic facet change at L3-4 and L5-S1. The SI joints are unremarkable, as visualized. The aorta and common iliac arteries are heavily calcified. IMPRESSION: 1. Osteopenia and degenerative change without evidence of fractures. 2. Spinal cord stimulator leads extend from the power source and enter the spinal canal at T12-L1 coursing cephalad off of the plane of imaging at T11. 3. Slight scoliosis, and grade 1 degenerative L4-5 spondylolisthesis. 4. Aortoiliac atherosclerosis. Electronically Signed   By: Almira Bar M.D.   On: 03/20/2023 07:05   DG  Chest 2 View  Result Date: 03/19/2023 CLINICAL DATA:  Generalized weakness. EXAM: CHEST - 2 VIEW COMPARISON:  August 20, 2016. FINDINGS: Stable cardiomediastinal silhouette. Both lungs are clear. The visualized skeletal structures are unremarkable. IMPRESSION: No active cardiopulmonary disease. Electronically Signed   By: Lupita Raider M.D.   On: 03/19/2023 18:24    (Echo, Carotid, EGD, Colonoscopy, ERCP)    Subjective:  No significant events, patient is eager to go home Discharge Exam: Vitals:   03/23/23 0515 03/23/23 0731  BP: (!) 147/99 (!) 178/80  Pulse: 68 70  Resp: 18 18  Temp: 97.8 F (36.6 C) 97.8 F (36.6 C)  SpO2: 96% 98%   Vitals:   03/22/23 1930 03/23/23 0029 03/23/23 0515 03/23/23 0731  BP: (!) 134/102 (!) 160/79 (!) 147/99 (!) 178/80  Pulse: 62 69 68 70  Resp: 20 20 18 18   Temp: 97.8 F (36.6 C) 97.8 F (36.6 C) 97.8 F (36.6 C) 97.8 F (36.6 C)  TempSrc: Oral Oral Oral Oral  SpO2: 96% 94% 96% 98%  Weight:      Height:        General: Pt is alert, awake, not in acute distress Cardiovascular: RRR, S1/S2 +, no rubs, no gallops Respiratory: CTA bilaterally, no wheezing, no rhonchi Abdominal: Soft, NT, ND, bowel sounds + Extremities: no edema, no cyanosis    The results of significant diagnostics from this hospitalization (including imaging, microbiology, ancillary and laboratory) are listed below for reference.     Microbiology: Recent Results (from the past 240 hour(s))  MRSA Next Gen by PCR, Nasal     Status: None   Collection Time: 03/20/23 12:10 AM   Specimen: Nasal Mucosa; Nasal Swab  Result Value Ref Range Status   MRSA by PCR Next Gen NOT DETECTED NOT DETECTED Final    Comment: (NOTE) The GeneXpert MRSA Assay (FDA approved for NASAL specimens only), is one component of a comprehensive MRSA colonization surveillance program. It is not intended to diagnose MRSA infection nor to guide or monitor treatment for MRSA infections. Test performance is not FDA approved in patients less than 33 years old. Performed at [facility], [address], [place], [place] [address]      Labs: BNP (last 3 results) No results for input(s): "BNP" in the last 8760 hours. Basic Metabolic Panel: Recent Labs  Lab 03/19/23 1825 03/20/23 0021 03/21/23 0020 03/22/23 0145  NA 135 134* 131* 130*  K 3.6 3.5 4.2  4.0  CL 101 100 99 98  CO2 25 24 22 22   GLUCOSE 134* 105* 145* 152*  BUN 12 11 13 21   CREATININE 0.79 0.89 0.81 0.81  CALCIUM 9.0 8.9 8.9 8.9  MG 2.0  --   --   --    Liver Function Tests: Recent Labs  Lab 03/19/23 1825 03/20/23 0021  AST 22 18  ALT 15 15  ALKPHOS 73 62  BILITOT 0.6 0.3  PROT 6.8 6.0*  ALBUMIN 3.8 3.3*   No results for input(s): "LIPASE", "AMYLASE" in the last 168 hours. No results for input(s): "AMMONIA" in the last 168 hours. CBC: Recent Labs  Lab 03/19/23 1825 03/20/23 0021 03/21/23 0020 03/22/23 0145  WBC 6.4 6.2 7.4 10.0  NEUTROABS 4.0  --   --   --   HGB 13.0 11.5* 12.2* 11.5*  HCT 38.0* 33.7* 35.4* 32.8*  MCV 95.2 94.4 92.9 94.5  PLT 269 277 287 279   Cardiac Enzymes: Recent Labs  Lab 03/19/23 1825  CKTOTAL 194   BNP: Invalid input(s): "POCBNP" CBG: Recent Labs  Lab 03/21/23 1118  GLUCAP 154*   D-Dimer No results for input(s): "DDIMER" in the last 72 hours. Hgb A1c No results for input(s): "HGBA1C" in the last 72 hours. Lipid Profile No results for input(s): "CHOL", "HDL", "LDLCALC", "TRIG", "CHOLHDL", "LDLDIRECT" in the last 72 hours. Thyroid function studies No results for input(s): "TSH", "T4TOTAL", "T3FREE", "THYROIDAB" in the last 72 hours.  Invalid input(s): "FREET3" Anemia work up No results for input(s): "VITAMINB12", "FOLATE", "FERRITIN", "TIBC", "IRON", "RETICCTPCT" in the last 72 hours. Urinalysis    Component Value Date/Time   COLORURINE YELLOW 03/19/2023 2030   APPEARANCEUR CLEAR 03/19/2023 2030   LABSPEC 1.014 03/19/2023 2030   PHURINE 5.0 03/19/2023 2030   GLUCOSEU NEGATIVE 03/19/2023 2030   HGBUR NEGATIVE 03/19/2023 2030   BILIRUBINUR NEGATIVE 03/19/2023 2030   KETONESUR 5 (A) 03/19/2023 2030   PROTEINUR NEGATIVE 03/19/2023 2030   NITRITE NEGATIVE 03/19/2023 2030   LEUKOCYTESUR NEGATIVE 03/19/2023 2030   Sepsis Labs Recent Labs  Lab 03/19/23 1825 03/20/23 0021 03/21/23 0020 03/22/23 0145  WBC  6.4 6.2 7.4 10.0   Microbiology Recent Results (from the past 240 hour(s))  MRSA Next Gen by PCR, Nasal     Status: None   Collection Time: 03/20/23 12:10 AM   Specimen: Nasal Mucosa; Nasal Swab  Result Value Ref Range Status   MRSA by PCR Next Gen NOT DETECTED NOT DETECTED Final    Comment: (NOTE) The GeneXpert MRSA Assay (FDA approved for NASAL specimens only), is one component of a comprehensive MRSA colonization surveillance program. It is not intended to diagnose MRSA infection nor to guide or monitor treatment for MRSA infections. Test performance is not FDA approved in patients less than 79 years old. Performed at [facility], [address] [place], [place] [address]      Time coordinating discharge: Over 30 minutes  SIGNED:   Huey Bienenstock, MD  [facility] 03/23/2023, 12:51 PM Pager   If 7PM-7AM, please contact night-coverage [email or web address] Password [email or web address]

## 2023-03-23 NOTE — Plan of Care (Signed)
  Problem: Education: Goal: Knowledge of General Education information will improve Description: Including pain rating scale, medication(s)/side effects and non-pharmacologic comfort measures Outcome: Progressing   Problem: Safety: Goal: Ability to remain free from injury will improve Outcome: Progressing   Problem: Skin Integrity: Goal: Will show signs of wound healing Outcome: Progressing

## 2023-03-25 DIAGNOSIS — Z87891 Personal history of nicotine dependence: Secondary | ICD-10-CM | POA: Diagnosis not present

## 2023-03-25 DIAGNOSIS — R200 Anesthesia of skin: Secondary | ICD-10-CM | POA: Diagnosis not present

## 2023-03-25 DIAGNOSIS — F1090 Alcohol use, unspecified, uncomplicated: Secondary | ICD-10-CM | POA: Diagnosis not present

## 2023-03-25 DIAGNOSIS — I10 Essential (primary) hypertension: Secondary | ICD-10-CM | POA: Diagnosis not present

## 2023-03-25 DIAGNOSIS — Z4789 Encounter for other orthopedic aftercare: Secondary | ICD-10-CM | POA: Diagnosis not present

## 2023-03-25 DIAGNOSIS — Z7982 Long term (current) use of aspirin: Secondary | ICD-10-CM | POA: Diagnosis not present

## 2023-03-25 DIAGNOSIS — I700 Atherosclerosis of aorta: Secondary | ICD-10-CM | POA: Diagnosis not present

## 2023-03-25 DIAGNOSIS — J439 Emphysema, unspecified: Secondary | ICD-10-CM | POA: Diagnosis not present

## 2023-03-25 DIAGNOSIS — Z981 Arthrodesis status: Secondary | ICD-10-CM | POA: Diagnosis not present

## 2023-03-27 DIAGNOSIS — M4802 Spinal stenosis, cervical region: Secondary | ICD-10-CM | POA: Diagnosis not present

## 2023-03-29 DIAGNOSIS — R200 Anesthesia of skin: Secondary | ICD-10-CM | POA: Diagnosis not present

## 2023-03-29 DIAGNOSIS — I700 Atherosclerosis of aorta: Secondary | ICD-10-CM | POA: Diagnosis not present

## 2023-03-29 DIAGNOSIS — Z4789 Encounter for other orthopedic aftercare: Secondary | ICD-10-CM | POA: Diagnosis not present

## 2023-03-29 DIAGNOSIS — Z981 Arthrodesis status: Secondary | ICD-10-CM | POA: Diagnosis not present

## 2023-03-29 DIAGNOSIS — I10 Essential (primary) hypertension: Secondary | ICD-10-CM | POA: Diagnosis not present

## 2023-03-29 DIAGNOSIS — J439 Emphysema, unspecified: Secondary | ICD-10-CM | POA: Diagnosis not present

## 2023-04-03 DIAGNOSIS — I10 Essential (primary) hypertension: Secondary | ICD-10-CM | POA: Diagnosis not present

## 2023-04-03 DIAGNOSIS — R200 Anesthesia of skin: Secondary | ICD-10-CM | POA: Diagnosis not present

## 2023-04-03 DIAGNOSIS — Z981 Arthrodesis status: Secondary | ICD-10-CM | POA: Diagnosis not present

## 2023-04-03 DIAGNOSIS — I700 Atherosclerosis of aorta: Secondary | ICD-10-CM | POA: Diagnosis not present

## 2023-04-03 DIAGNOSIS — Z4789 Encounter for other orthopedic aftercare: Secondary | ICD-10-CM | POA: Diagnosis not present

## 2023-04-03 DIAGNOSIS — J439 Emphysema, unspecified: Secondary | ICD-10-CM | POA: Diagnosis not present

## 2023-04-05 DIAGNOSIS — R200 Anesthesia of skin: Secondary | ICD-10-CM | POA: Diagnosis not present

## 2023-04-05 DIAGNOSIS — I10 Essential (primary) hypertension: Secondary | ICD-10-CM | POA: Diagnosis not present

## 2023-04-05 DIAGNOSIS — Z981 Arthrodesis status: Secondary | ICD-10-CM | POA: Diagnosis not present

## 2023-04-05 DIAGNOSIS — J439 Emphysema, unspecified: Secondary | ICD-10-CM | POA: Diagnosis not present

## 2023-04-05 DIAGNOSIS — I700 Atherosclerosis of aorta: Secondary | ICD-10-CM | POA: Diagnosis not present

## 2023-04-05 DIAGNOSIS — Z4789 Encounter for other orthopedic aftercare: Secondary | ICD-10-CM | POA: Diagnosis not present

## 2023-04-09 DIAGNOSIS — Z4789 Encounter for other orthopedic aftercare: Secondary | ICD-10-CM | POA: Diagnosis not present

## 2023-04-09 DIAGNOSIS — I10 Essential (primary) hypertension: Secondary | ICD-10-CM | POA: Diagnosis not present

## 2023-04-09 DIAGNOSIS — I700 Atherosclerosis of aorta: Secondary | ICD-10-CM | POA: Diagnosis not present

## 2023-04-09 DIAGNOSIS — Z981 Arthrodesis status: Secondary | ICD-10-CM | POA: Diagnosis not present

## 2023-04-09 DIAGNOSIS — R200 Anesthesia of skin: Secondary | ICD-10-CM | POA: Diagnosis not present

## 2023-04-09 DIAGNOSIS — J439 Emphysema, unspecified: Secondary | ICD-10-CM | POA: Diagnosis not present

## 2023-04-11 ENCOUNTER — Emergency Department (HOSPITAL_COMMUNITY): Payer: Medicare Other

## 2023-04-11 ENCOUNTER — Other Ambulatory Visit: Payer: Self-pay

## 2023-04-11 ENCOUNTER — Inpatient Hospital Stay (HOSPITAL_COMMUNITY)
Admission: EM | Admit: 2023-04-11 | Discharge: 2023-04-14 | DRG: 908 | Disposition: A | Discharge status: Home / Self Care | Payer: Medicare Other | Attending: Neurosurgery | Admitting: Neurosurgery

## 2023-04-11 DIAGNOSIS — J439 Emphysema, unspecified: Secondary | ICD-10-CM | POA: Diagnosis not present

## 2023-04-11 DIAGNOSIS — Z981 Arthrodesis status: Secondary | ICD-10-CM | POA: Diagnosis not present

## 2023-04-11 DIAGNOSIS — Z23 Encounter for immunization: Secondary | ICD-10-CM

## 2023-04-11 DIAGNOSIS — Z7982 Long term (current) use of aspirin: Secondary | ICD-10-CM

## 2023-04-11 DIAGNOSIS — I700 Atherosclerosis of aorta: Secondary | ICD-10-CM | POA: Diagnosis not present

## 2023-04-11 DIAGNOSIS — Z888 Allergy status to other drugs, medicaments and biological substances status: Secondary | ICD-10-CM | POA: Diagnosis not present

## 2023-04-11 DIAGNOSIS — Z79899 Other long term (current) drug therapy: Secondary | ICD-10-CM

## 2023-04-11 DIAGNOSIS — Y834 Other reconstructive surgery as the cause of abnormal reaction of the patient, or of later complication, without mention of misadventure at the time of the procedure: Secondary | ICD-10-CM | POA: Diagnosis present

## 2023-04-11 DIAGNOSIS — R531 Weakness: Principal | ICD-10-CM

## 2023-04-11 DIAGNOSIS — R29818 Other symptoms and signs involving the nervous system: Secondary | ICD-10-CM | POA: Diagnosis not present

## 2023-04-11 DIAGNOSIS — M4803 Spinal stenosis, cervicothoracic region: Secondary | ICD-10-CM | POA: Diagnosis not present

## 2023-04-11 DIAGNOSIS — G319 Degenerative disease of nervous system, unspecified: Secondary | ICD-10-CM | POA: Diagnosis not present

## 2023-04-11 DIAGNOSIS — G959 Disease of spinal cord, unspecified: Secondary | ICD-10-CM | POA: Diagnosis present

## 2023-04-11 DIAGNOSIS — R200 Anesthesia of skin: Secondary | ICD-10-CM | POA: Diagnosis not present

## 2023-04-11 DIAGNOSIS — R270 Ataxia, unspecified: Secondary | ICD-10-CM | POA: Diagnosis present

## 2023-04-11 DIAGNOSIS — M2548 Effusion, other site: Secondary | ICD-10-CM | POA: Diagnosis not present

## 2023-04-11 DIAGNOSIS — G9762 Postprocedural hematoma of a nervous system organ or structure following other procedure: Secondary | ICD-10-CM | POA: Diagnosis present

## 2023-04-11 DIAGNOSIS — M5003 Cervical disc disorder with myelopathy, cervicothoracic region: Secondary | ICD-10-CM | POA: Diagnosis not present

## 2023-04-11 DIAGNOSIS — G061 Intraspinal abscess and granuloma: Secondary | ICD-10-CM | POA: Diagnosis not present

## 2023-04-11 DIAGNOSIS — I10 Essential (primary) hypertension: Secondary | ICD-10-CM | POA: Diagnosis not present

## 2023-04-11 DIAGNOSIS — M4712 Other spondylosis with myelopathy, cervical region: Secondary | ICD-10-CM | POA: Diagnosis present

## 2023-04-11 DIAGNOSIS — F17210 Nicotine dependence, cigarettes, uncomplicated: Secondary | ICD-10-CM | POA: Diagnosis present

## 2023-04-11 DIAGNOSIS — S14109A Unspecified injury at unspecified level of cervical spinal cord, initial encounter: Secondary | ICD-10-CM | POA: Diagnosis not present

## 2023-04-11 DIAGNOSIS — I6782 Cerebral ischemia: Secondary | ICD-10-CM | POA: Diagnosis not present

## 2023-04-11 DIAGNOSIS — S064X0A Epidural hemorrhage without loss of consciousness, initial encounter: Secondary | ICD-10-CM | POA: Diagnosis not present

## 2023-04-11 DIAGNOSIS — M4802 Spinal stenosis, cervical region: Secondary | ICD-10-CM | POA: Diagnosis not present

## 2023-04-11 DIAGNOSIS — Z4789 Encounter for other orthopedic aftercare: Secondary | ICD-10-CM | POA: Diagnosis not present

## 2023-04-11 DIAGNOSIS — G9520 Unspecified cord compression: Secondary | ICD-10-CM | POA: Diagnosis not present

## 2023-04-11 DIAGNOSIS — G992 Myelopathy in diseases classified elsewhere: Secondary | ICD-10-CM | POA: Diagnosis not present

## 2023-04-11 DIAGNOSIS — R5383 Other fatigue: Secondary | ICD-10-CM | POA: Diagnosis not present

## 2023-04-11 LAB — CBC WITH DIFFERENTIAL/PLATELET
Abs Immature Granulocytes: 0.01 10*3/uL (ref 0.00–0.07)
Basophils Absolute: 0.1 10*3/uL (ref 0.0–0.1)
Basophils Relative: 1 %
Eosinophils Absolute: 0.4 10*3/uL (ref 0.0–0.5)
Eosinophils Relative: 7 %
HCT: 36.1 % — ABNORMAL LOW (ref 39.0–52.0)
Hemoglobin: 12.2 g/dL — ABNORMAL LOW (ref 13.0–17.0)
Immature Granulocytes: 0 %
Lymphocytes Relative: 29 %
Lymphs Abs: 1.6 10*3/uL (ref 0.7–4.0)
MCH: 32.8 pg (ref 26.0–34.0)
MCHC: 33.8 g/dL (ref 30.0–36.0)
MCV: 97 fL (ref 80.0–100.0)
Monocytes Absolute: 0.5 10*3/uL (ref 0.1–1.0)
Monocytes Relative: 10 %
Neutro Abs: 2.9 10*3/uL (ref 1.7–7.7)
Neutrophils Relative %: 53 %
Platelets: 212 10*3/uL (ref 150–400)
RBC: 3.72 MIL/uL — ABNORMAL LOW (ref 4.22–5.81)
RDW: 11.9 % (ref 11.5–15.5)
WBC: 5.5 10*3/uL (ref 4.0–10.5)
nRBC: 0 % (ref 0.0–0.2)

## 2023-04-11 LAB — COMPREHENSIVE METABOLIC PANEL
ALT: 15 U/L (ref 0–44)
AST: 16 U/L (ref 15–41)
Albumin: 3.5 g/dL (ref 3.5–5.0)
Alkaline Phosphatase: 68 U/L (ref 38–126)
Anion gap: 8 (ref 5–15)
BUN: 14 mg/dL (ref 8–23)
CO2: 26 mmol/L (ref 22–32)
Calcium: 9.2 mg/dL (ref 8.9–10.3)
Chloride: 101 mmol/L (ref 98–111)
Creatinine, Ser: 0.8 mg/dL (ref 0.61–1.24)
GFR, Estimated: >60 mL/min (ref >60)
Glucose, Bld: 106 mg/dL — ABNORMAL HIGH (ref 70–99)
Potassium: 4.2 mmol/L (ref 3.5–5.1)
Sodium: 135 mmol/L (ref 135–145)
Total Bilirubin: 0.2 mg/dL — ABNORMAL LOW (ref 0.3–1.2)
Total Protein: 6.4 g/dL — ABNORMAL LOW (ref 6.5–8.1)

## 2023-04-11 MED ORDER — OXYCODONE HCL 5 MG PO TABS
5.0000 mg | ORAL_TABLET | Freq: Once | ORAL | Status: AC
  Administered 2023-04-12: 5 mg via ORAL
  Filled 2023-04-11: qty 1

## 2023-04-11 NOTE — ED Notes (Signed)
Pt reports consistent shoulder and back of neck pain, also reports having spinal surgery 03/20/23. Denies hx of similar symptoms.

## 2023-04-11 NOTE — ED Provider Notes (Signed)
[place] [facility]ERGENCY DEPARTMENT AT [facility] Provider Note   CSN: [id number] Arrival date & time: 04/11/23  1925     History  Chief Complaint  Patient presents with   Weakness    Sean Austin is a 72 y.o. male.  Patient here with generalized weakness.  He is feeling weak in his arms and legs again like he did a few weeks ago when he ended up having cervical spine surgery.  He has a history of hypertension.  History of alcohol use.  He has been doing well since his surgery been working physical therapy.  Been ambulating with a walker but is having a harder time today and feels a little bit weaker in his arms and legs and he has been in the last few days.  He has not followed up with neurosurgery.  Denies any fevers or chills.  Denies any headache neck pain or new falls.  He has felt good strength but still has some ongoing pain issues.  He is worried about how the weakness seems to have gotten worse today and he states that things progressed pretty rapidly last time and want to make sure nothing is wrong.  Denies any chest pain shortness of breath weakness numbness tingling.  No pain with urination.  No black or bloody stools.  The history is provided by the patient.       Home Medications Prior to Admission medications   Medication Sig Start Date End Date Taking? Authorizing Provider  aspirin 81 MG chewable tablet Chew 81 mg by mouth daily. 11/28/11  Yes [provider]  diltiazem (DILACOR XR) 180 MG 24 hr capsule Take 180 mg by mouth daily.   Yes [provider]  folic acid (FOLVITE) 400 MCG tablet Take 400 mcg by mouth daily. 11/28/11  Yes [provider]  HYDROcodone-acetaminophen (NORCO/VICODIN) 5-325 MG tablet Take 1 tablet by mouth every 6 (six) hours as needed for moderate pain. 03/23/23  Yes Elgergawy, Leana Roe, MD  Multiple Vitamin (MULTIVITAMIN WITH MINERALS) TABS tablet Take 1 tablet by mouth daily. 03/24/23  Yes Elgergawy, Leana Roe, MD   olmesartan (BENICAR) 40 MG tablet Take 40 mg by mouth daily. 10/31/20  Yes [provider]  Omega-3 Fatty Acids (KP OMEGA-3 FISH OIL) 1200 MG CPDR Take 1 capsule by mouth daily. 11/28/11  Yes [provider]  pantoprazole (PROTONIX) 40 MG tablet Take 1 tablet (40 mg total) by mouth daily. 03/23/23 04/22/23 Yes Elgergawy, Leana Roe, MD  Saw Palmetto, Serenoa repens, (SAW PALMETTO PO) Take 1 capsule by mouth daily. 320 mg.   Yes [provider]  simvastatin (ZOCOR) 40 MG tablet Take 40 mg by mouth daily.   Yes [provider]  tamsulosin (FLOMAX) 0.4 MG CAPS capsule Take 0.8 mg by mouth daily.   Yes [provider]  thiamine (VITAMIN B1) 100 MG tablet Take 1 tablet (100 mg total) by mouth daily. 03/23/23  Yes Elgergawy, Leana Roe, MD  tiZANidine (ZANAFLEX) 4 MG tablet Take 4 mg by mouth every 8 (eight) hours as needed for muscle spasms.   Yes [provider]  TURMERIC PO Take 500 mg by mouth every evening.   Yes [provider]      Allergies    Amlodipine and Ace inhibitors    Review of Systems   Review of Systems  Physical Exam Updated Vital Signs  ED Triage Vitals  Encounter Vitals Group     BP 04/11/23 1931 (!) 184/110  Systolic BP Percentile --      Diastolic BP Percentile --      Pulse Rate 04/11/23 1931 76     Resp 04/11/23 1934 13     Temp 04/11/23 1935 (!) 97.4 F (36.3 C)     Temp Source 04/11/23 1935 Oral     SpO2 04/11/23 1931 100 %     Weight 04/11/23 1930 185 lb (83.9 kg)     Height --      Head Circumference --      Peak Flow --      Pain Score 04/11/23 1929 6     Pain Loc --      Pain Education --      Exclude from Growth Chart --      Physical Exam Vitals and nursing note reviewed.  Constitutional:      General: He is not in acute distress.    Appearance: He is well-developed. He is not ill-appearing.  HENT:     Head: Normocephalic and atraumatic.     Nose: Nose normal.     Mouth/Throat:      Mouth: Mucous membranes are moist.  Eyes:     Extraocular Movements: Extraocular movements intact.     Conjunctiva/sclera: Conjunctivae normal.     Pupils: Pupils are equal, round, and reactive to light.  Cardiovascular:     Rate and Rhythm: Normal rate and regular rhythm.     Pulses: Normal pulses.     Heart sounds: Normal heart sounds. No murmur heard. Pulmonary:     Effort: Pulmonary effort is normal. No respiratory distress.     Breath sounds: Normal breath sounds.  Abdominal:     General: Abdomen is flat.     Palpations: Abdomen is soft.     Tenderness: There is no abdominal tenderness.  Musculoskeletal:        General: No swelling.     Cervical back: Normal range of motion and neck supple.  Skin:    General: Skin is warm and dry.     Capillary Refill: Capillary refill takes less than 2 seconds.     Comments: Anterior cervical spine surgical scars clean dry and intact  Neurological:     General: No focal deficit present.     Mental Status: He is alert and oriented to person, place, and time.     Cranial Nerves: No cranial nerve deficit.     Sensory: No sensory deficit.     Motor: No weakness.     Coordination: Coordination normal.     Comments: Appears to have fairly symmetric strength throughout, no sensation deficits, does have a little bit of weakness in all 4 extremities, normal visual fields, normal speech, cranial nerves are intact  Psychiatric:        Mood and Affect: Mood normal.     ED Results / Procedures / Treatments   Labs (all labs ordered are listed, but only abnormal results are displayed) Labs Reviewed  CBC WITH DIFFERENTIAL/PLATELET - Abnormal; Notable for the following components:      Result Value   RBC 3.72 (*)    Hemoglobin 12.2 (*)    HCT 36.1 (*)    All other components within normal limits  COMPREHENSIVE METABOLIC PANEL - Abnormal; Notable for the following components:   Glucose, Bld 106 (*)    Total Protein 6.4 (*)    Total Bilirubin 0.2  (*)    All other components within normal limits  URINALYSIS, ROUTINE W REFLEX MICROSCOPIC  EKG EKG Interpretation Date/Time:  Thursday April 11 2023 19:34:53 EDT Ventricular Rate:  73 PR Interval:  157 QRS Duration:  107 QT Interval:  407 QTC Calculation: 449 R Axis:   51  Text Interpretation: Sinus rhythm Confirmed by Virgina Norfolk [phone]) on 04/11/2023 10:08:20 PM  Radiology DG Chest Portable 1 View  Result Date: 04/11/2023 CLINICAL DATA:  Weakness, fatigue, hypertension EXAM: PORTABLE CHEST 1 VIEW COMPARISON:  03/19/2023, 03/21/2023 FINDINGS: 2 frontal views of the chest demonstrate a stable cardiac silhouette. No acute airspace disease, effusion, or pneumothorax. Stable spinal stimulator. No acute bony abnormalities. IMPRESSION: 1. Stable chest, no acute process. Electronically Signed   By: Sharlet Salina M.D.   On: 04/11/2023 20:49    Procedures Procedures    Medications Ordered in ED Medications  oxyCODONE (Oxy IR/ROXICODONE) immediate release tablet 5 mg (has no administration in time range)    ED Course/ Medical Decision Making/ A&P                                 Medical Decision Making Amount and/or Complexity of Data Reviewed Labs: ordered. Radiology: ordered.  Risk Prescription drug management.   Clayton Dentremont is here with weakness in his arms and legs that he thinks has increased and she has neck surgery a couple weeks ago.  Patient still having some upper back and neck discomfort since surgery a few weeks ago for anterior decompression of cervical spine.  He is working physical therapy and doing well using the walker to ambulate.  But felt like his weakness of his arms and legs may be increased today and he was concerned as he states that he had some rapid progression of his symptoms prior to the surgery a few weeks ago.  But does sound like he has some history of chronic weakness and fatigue.  Issues with high blood pressure as well.  He does not seem to  have any stroke symptoms.  No obvious focal weakness or numbness on exam.  He seems somewhat symmetrically weak throughout but is able to lift both his arms and legs against gravity and hold them for a while.  Overall very difficult to really gauge his strength as he seems to be doing pretty well but I do not know what his baseline has been here recently.  He is mostly concerned that maybe something is going on in the neck again.  I talked with neurosurgery Dr. Conchita Paris and overall shared decision was made with the patient to go ahead and get a new MRI of the brain and neck to make sure there is no obvious acute processes.  Will check basic labs to make sure there is no significant anemia or electrolyte abnormality.  Overall I do suspect that he is likely having more chronic issues/still working to regain full strength for improved strength.  He seems neurologically intact.  His vital signs are unremarkable except for some high blood pressure.  He has no fever.  Ultimately patient per my review and interpretation of labs and no significant anemia or electrolyte abnormality or kidney injury.  No leukocytosis.  Chest x-ray showed no evidence of pneumonia.  EKG shows sinus rhythm.  I reviewed interpreted labs, imaging, EKG.  I have no concern for infectious process or cardiac or pulmonary process.  MRIs are pending at time of handoff to oncoming ED staff.  Anticipate if there is no major abnormality can follow-up closely with his  neurosurgeon and continue to work with physical therapy.  Will hopefully give him some reassurance.  Please see oncoming ED doctor's note for further results, evaluation, disposition of the patient.  This chart was dictated using voice recognition software.  Despite best efforts to proofread,  errors can occur which can change the documentation meaning.         Final Clinical Impression(s) / ED Diagnoses Final diagnoses:  Weakness    Rx / DC Orders ED Discharge Orders      None         Virgina Norfolk, DO 04/11/23 2331

## 2023-04-11 NOTE — ED Triage Notes (Signed)
Pt BIB EMS from home. C/o increased weakness since this morning. Chronic hx of weakness/fatigue and hypertension.    EMS: unremarkable EKG, cbg 96, T 97.5, 182/76

## 2023-04-12 ENCOUNTER — Other Ambulatory Visit: Payer: Self-pay

## 2023-04-12 ENCOUNTER — Emergency Department (HOSPITAL_COMMUNITY): Payer: Medicare Other

## 2023-04-12 ENCOUNTER — Inpatient Hospital Stay (HOSPITAL_COMMUNITY)
Admission: EM | Disposition: A | Discharge status: Home / Self Care | Payer: Self-pay | Source: Home / Self Care | Attending: Neurosurgery

## 2023-04-12 ENCOUNTER — Encounter (HOSPITAL_COMMUNITY): Payer: Self-pay | Admitting: *Deleted

## 2023-04-12 ENCOUNTER — Emergency Department (HOSPITAL_COMMUNITY): Payer: Medicare Other | Admitting: Certified Registered Nurse Anesthetist

## 2023-04-12 DIAGNOSIS — I6782 Cerebral ischemia: Secondary | ICD-10-CM | POA: Diagnosis not present

## 2023-04-12 DIAGNOSIS — G061 Intraspinal abscess and granuloma: Secondary | ICD-10-CM | POA: Diagnosis not present

## 2023-04-12 DIAGNOSIS — I10 Essential (primary) hypertension: Secondary | ICD-10-CM | POA: Diagnosis present

## 2023-04-12 DIAGNOSIS — M2548 Effusion, other site: Secondary | ICD-10-CM | POA: Diagnosis not present

## 2023-04-12 DIAGNOSIS — Z79899 Other long term (current) drug therapy: Secondary | ICD-10-CM | POA: Diagnosis not present

## 2023-04-12 DIAGNOSIS — Y834 Other reconstructive surgery as the cause of abnormal reaction of the patient, or of later complication, without mention of misadventure at the time of the procedure: Secondary | ICD-10-CM | POA: Diagnosis present

## 2023-04-12 DIAGNOSIS — G992 Myelopathy in diseases classified elsewhere: Secondary | ICD-10-CM | POA: Diagnosis not present

## 2023-04-12 DIAGNOSIS — M4803 Spinal stenosis, cervicothoracic region: Secondary | ICD-10-CM | POA: Diagnosis not present

## 2023-04-12 DIAGNOSIS — Z23 Encounter for immunization: Secondary | ICD-10-CM | POA: Diagnosis present

## 2023-04-12 DIAGNOSIS — G959 Disease of spinal cord, unspecified: Secondary | ICD-10-CM | POA: Diagnosis not present

## 2023-04-12 DIAGNOSIS — R29818 Other symptoms and signs involving the nervous system: Secondary | ICD-10-CM | POA: Diagnosis not present

## 2023-04-12 DIAGNOSIS — M4802 Spinal stenosis, cervical region: Secondary | ICD-10-CM | POA: Diagnosis not present

## 2023-04-12 DIAGNOSIS — M5003 Cervical disc disorder with myelopathy, cervicothoracic region: Secondary | ICD-10-CM | POA: Diagnosis not present

## 2023-04-12 DIAGNOSIS — G319 Degenerative disease of nervous system, unspecified: Secondary | ICD-10-CM | POA: Diagnosis not present

## 2023-04-12 DIAGNOSIS — S064X0A Epidural hemorrhage without loss of consciousness, initial encounter: Secondary | ICD-10-CM | POA: Diagnosis not present

## 2023-04-12 DIAGNOSIS — M4712 Other spondylosis with myelopathy, cervical region: Secondary | ICD-10-CM | POA: Diagnosis not present

## 2023-04-12 DIAGNOSIS — R270 Ataxia, unspecified: Secondary | ICD-10-CM | POA: Diagnosis present

## 2023-04-12 DIAGNOSIS — Z981 Arthrodesis status: Secondary | ICD-10-CM | POA: Diagnosis not present

## 2023-04-12 DIAGNOSIS — G9520 Unspecified cord compression: Secondary | ICD-10-CM | POA: Diagnosis not present

## 2023-04-12 DIAGNOSIS — Z888 Allergy status to other drugs, medicaments and biological substances status: Secondary | ICD-10-CM | POA: Diagnosis not present

## 2023-04-12 DIAGNOSIS — R531 Weakness: Secondary | ICD-10-CM | POA: Diagnosis not present

## 2023-04-12 DIAGNOSIS — Z7982 Long term (current) use of aspirin: Secondary | ICD-10-CM | POA: Diagnosis not present

## 2023-04-12 DIAGNOSIS — S14109A Unspecified injury at unspecified level of cervical spinal cord, initial encounter: Secondary | ICD-10-CM | POA: Diagnosis not present

## 2023-04-12 DIAGNOSIS — G9762 Postprocedural hematoma of a nervous system organ or structure following other procedure: Secondary | ICD-10-CM | POA: Diagnosis present

## 2023-04-12 DIAGNOSIS — F17210 Nicotine dependence, cigarettes, uncomplicated: Secondary | ICD-10-CM | POA: Diagnosis present

## 2023-04-12 HISTORY — PX: WOUND EXPLORATION: SHX6188

## 2023-04-12 LAB — URINALYSIS, ROUTINE W REFLEX MICROSCOPIC
Bilirubin Urine: NEGATIVE
Glucose, UA: NEGATIVE mg/dL
Hgb urine dipstick: NEGATIVE
Ketones, ur: NEGATIVE mg/dL
Leukocytes,Ua: NEGATIVE
Nitrite: NEGATIVE
Protein, ur: NEGATIVE mg/dL
Specific Gravity, Urine: 1.009 (ref 1.005–1.030)
pH: 7 (ref 5.0–8.0)

## 2023-04-12 LAB — SURGICAL PCR SCREEN
MRSA, PCR: NEGATIVE
Staphylococcus aureus: NEGATIVE

## 2023-04-12 LAB — SEDIMENTATION RATE: Sed Rate: 36 mm/hr — ABNORMAL HIGH (ref 0–16)

## 2023-04-12 LAB — C-REACTIVE PROTEIN: CRP: 0.6 mg/dL (ref <1.0)

## 2023-04-12 SURGERY — WOUND EXPLORATION
Anesthesia: General | Site: Neck

## 2023-04-12 MED ORDER — ASPIRIN 81 MG PO CHEW
81.0000 mg | CHEWABLE_TABLET | Freq: Every day | ORAL | Status: DC
  Administered 2023-04-13 – 2023-04-14 (×2): 81 mg via ORAL
  Filled 2023-04-12 (×2): qty 1

## 2023-04-12 MED ORDER — ROCURONIUM BROMIDE 10 MG/ML (PF) SYRINGE
PREFILLED_SYRINGE | INTRAVENOUS | Status: AC
  Filled 2023-04-12: qty 20

## 2023-04-12 MED ORDER — CEFAZOLIN SODIUM-DEXTROSE 2-4 GM/100ML-% IV SOLN
2.0000 g | Freq: Three times a day (TID) | INTRAVENOUS | Status: DC
  Administered 2023-04-12 – 2023-04-14 (×5): 2 g via INTRAVENOUS
  Filled 2023-04-12 (×5): qty 100

## 2023-04-12 MED ORDER — FENTANYL CITRATE (PF) 250 MCG/5ML IJ SOLN
INTRAMUSCULAR | Status: DC | PRN
  Administered 2023-04-12 (×3): 50 ug via INTRAVENOUS

## 2023-04-12 MED ORDER — EPHEDRINE 5 MG/ML INJ
INTRAVENOUS | Status: AC
  Filled 2023-04-12: qty 10

## 2023-04-12 MED ORDER — GELATIN ABSORBABLE MT POWD
OROMUCOSAL | Status: DC | PRN
Start: 2023-04-12 — End: 2023-04-12
  Administered 2023-04-12: 1 via TOPICAL

## 2023-04-12 MED ORDER — ACETAMINOPHEN 500 MG PO TABS
1000.0000 mg | ORAL_TABLET | Freq: Once | ORAL | Status: AC
  Administered 2023-04-12: 1000 mg via ORAL
  Filled 2023-04-12: qty 2

## 2023-04-12 MED ORDER — ONDANSETRON HCL 4 MG/2ML IJ SOLN
INTRAMUSCULAR | Status: DC | PRN
  Administered 2023-04-12: 4 mg via INTRAVENOUS

## 2023-04-12 MED ORDER — ONDANSETRON HCL 4 MG/2ML IJ SOLN
INTRAMUSCULAR | Status: AC
  Filled 2023-04-12: qty 8

## 2023-04-12 MED ORDER — HYDROCODONE-ACETAMINOPHEN 5-325 MG PO TABS
2.0000 | ORAL_TABLET | ORAL | Status: DC | PRN
  Administered 2023-04-14: 2 via ORAL

## 2023-04-12 MED ORDER — ACETAMINOPHEN 325 MG PO TABS: 650.0000 mg | ORAL_TABLET | ORAL | Status: DC | PRN

## 2023-04-12 MED ORDER — SAW PALMETTO 160 MG PO CAPS: ORAL_CAPSULE | Freq: Every day | ORAL | Status: DC

## 2023-04-12 MED ORDER — PHENYLEPHRINE 80 MCG/ML (10ML) SYRINGE FOR IV PUSH (FOR BLOOD PRESSURE SUPPORT)
PREFILLED_SYRINGE | INTRAVENOUS | Status: AC
  Filled 2023-04-12: qty 40

## 2023-04-12 MED ORDER — ONDANSETRON HCL 4 MG/2ML IJ SOLN: 4.0000 mg | Freq: Once | INTRAMUSCULAR | Status: DC | PRN

## 2023-04-12 MED ORDER — TURMERIC 500 MG PO CAPS: 500.0000 mg | ORAL_CAPSULE | Freq: Every evening | ORAL | Status: DC

## 2023-04-12 MED ORDER — FENTANYL CITRATE (PF) 250 MCG/5ML IJ SOLN
INTRAMUSCULAR | Status: AC
  Filled 2023-04-12: qty 5

## 2023-04-12 MED ORDER — PROPOFOL 10 MG/ML IV BOLUS
INTRAVENOUS | Status: DC | PRN
  Administered 2023-04-12: 140 mg via INTRAVENOUS

## 2023-04-12 MED ORDER — ONDANSETRON HCL 4 MG/2ML IJ SOLN: 4.0000 mg | Freq: Four times a day (QID) | INTRAMUSCULAR | Status: DC | PRN

## 2023-04-12 MED ORDER — PANTOPRAZOLE SODIUM 40 MG PO TBEC
40.0000 mg | DELAYED_RELEASE_TABLET | Freq: Every day | ORAL | Status: DC
  Administered 2023-04-12 – 2023-04-14 (×3): 40 mg via ORAL
  Filled 2023-04-12 (×3): qty 1

## 2023-04-12 MED ORDER — DEXAMETHASONE SODIUM PHOSPHATE 10 MG/ML IJ SOLN
INTRAMUSCULAR | Status: AC
  Filled 2023-04-12: qty 3

## 2023-04-12 MED ORDER — THROMBIN 5000 UNITS EX SOLR
CUTANEOUS | Status: AC
  Filled 2023-04-12: qty 10000

## 2023-04-12 MED ORDER — HYDROCODONE-ACETAMINOPHEN 5-325 MG PO TABS
1.0000 | ORAL_TABLET | Freq: Once | ORAL | Status: AC
  Administered 2023-04-12: 1 via ORAL
  Filled 2023-04-12: qty 1

## 2023-04-12 MED ORDER — 0.9 % SODIUM CHLORIDE (POUR BTL) OPTIME
TOPICAL | Status: DC | PRN
  Administered 2023-04-12: 1000 mL

## 2023-04-12 MED ORDER — SUCCINYLCHOLINE CHLORIDE 200 MG/10ML IV SOSY
PREFILLED_SYRINGE | INTRAVENOUS | Status: AC
  Filled 2023-04-12: qty 20

## 2023-04-12 MED ORDER — OXYCODONE HCL 5 MG PO TABS: 5.0000 mg | ORAL_TABLET | Freq: Once | ORAL | Status: DC | PRN

## 2023-04-12 MED ORDER — LACTATED RINGERS IV SOLN: INTRAVENOUS | Status: DC

## 2023-04-12 MED ORDER — ORAL CARE MOUTH RINSE: 15.0000 mL | Freq: Once | OROMUCOSAL | Status: AC

## 2023-04-12 MED ORDER — SODIUM CHLORIDE 0.9% FLUSH
3.0000 mL | Freq: Two times a day (BID) | INTRAVENOUS | Status: DC
  Administered 2023-04-12 – 2023-04-14 (×4): 3 mL via INTRAVENOUS

## 2023-04-12 MED ORDER — ONDANSETRON HCL 4 MG PO TABS: 4.0000 mg | ORAL_TABLET | Freq: Four times a day (QID) | ORAL | Status: DC | PRN

## 2023-04-12 MED ORDER — ACETAMINOPHEN 650 MG RE SUPP: 650.0000 mg | RECTAL | Status: DC | PRN

## 2023-04-12 MED ORDER — PHENOL 1.4 % MT LIQD: 1.0000 | OROMUCOSAL | Status: DC | PRN

## 2023-04-12 MED ORDER — DEXAMETHASONE SODIUM PHOSPHATE 10 MG/ML IJ SOLN
10.0000 mg | Freq: Once | INTRAMUSCULAR | Status: AC
  Administered 2023-04-12: 10 mg via INTRAVENOUS
  Filled 2023-04-12: qty 1

## 2023-04-12 MED ORDER — CYCLOBENZAPRINE HCL 10 MG PO TABS: 10.0000 mg | ORAL_TABLET | Freq: Three times a day (TID) | ORAL | Status: DC | PRN

## 2023-04-12 MED ORDER — SUGAMMADEX SODIUM 200 MG/2ML IV SOLN
INTRAVENOUS | Status: DC | PRN
  Administered 2023-04-12: 200 mg via INTRAVENOUS

## 2023-04-12 MED ORDER — THROMBIN 5000 UNITS EX SOLR
CUTANEOUS | Status: DC | PRN
  Administered 2023-04-12: 5000 [IU] via TOPICAL

## 2023-04-12 MED ORDER — CHLORHEXIDINE GLUCONATE 0.12 % MT SOLN
15.0000 mL | Freq: Once | OROMUCOSAL | Status: AC
  Administered 2023-04-12: 15 mL via OROMUCOSAL

## 2023-04-12 MED ORDER — FENTANYL CITRATE (PF) 100 MCG/2ML IJ SOLN: 25.0000 ug | INTRAMUSCULAR | Status: DC | PRN

## 2023-04-12 MED ORDER — TAMSULOSIN HCL 0.4 MG PO CAPS
0.8000 mg | ORAL_CAPSULE | Freq: Every day | ORAL | Status: DC
  Administered 2023-04-12 – 2023-04-14 (×3): 0.8 mg via ORAL
  Filled 2023-04-12 (×3): qty 2

## 2023-04-12 MED ORDER — FOLIC ACID 1 MG PO TABS
0.5000 mg | ORAL_TABLET | Freq: Every day | ORAL | Status: DC
  Administered 2023-04-13 – 2023-04-14 (×2): 0.5 mg via ORAL
  Filled 2023-04-12 (×2): qty 1

## 2023-04-12 MED ORDER — MENTHOL 3 MG MT LOZG
1.0000 | LOZENGE | OROMUCOSAL | Status: DC | PRN
  Filled 2023-04-12: qty 9

## 2023-04-12 MED ORDER — ALUM & MAG HYDROXIDE-SIMETH 200-200-20 MG/5ML PO SUSP: 30.0000 mL | Freq: Four times a day (QID) | ORAL | Status: DC | PRN

## 2023-04-12 MED ORDER — DEXAMETHASONE SODIUM PHOSPHATE 10 MG/ML IJ SOLN
INTRAMUSCULAR | Status: DC | PRN
  Administered 2023-04-12: 4 mg via INTRAVENOUS

## 2023-04-12 MED ORDER — ADULT MULTIVITAMIN W/MINERALS CH
1.0000 | ORAL_TABLET | Freq: Every day | ORAL | Status: DC
  Administered 2023-04-13 – 2023-04-14 (×2): 1 via ORAL
  Filled 2023-04-12 (×2): qty 1

## 2023-04-12 MED ORDER — PANTOPRAZOLE SODIUM 40 MG IV SOLR: 40.0000 mg | Freq: Every day | INTRAVENOUS | Status: DC

## 2023-04-12 MED ORDER — ATORVASTATIN CALCIUM 10 MG PO TABS
20.0000 mg | ORAL_TABLET | Freq: Every day | ORAL | Status: DC
  Administered 2023-04-13 – 2023-04-14 (×2): 20 mg via ORAL
  Filled 2023-04-12 (×3): qty 2

## 2023-04-12 MED ORDER — SODIUM CHLORIDE 0.9 % IV BOLUS
500.0000 mL | Freq: Once | INTRAVENOUS | Status: AC
  Administered 2023-04-12: 500 mL via INTRAVENOUS

## 2023-04-12 MED ORDER — HYDROCODONE-ACETAMINOPHEN 5-325 MG PO TABS
1.0000 | ORAL_TABLET | Freq: Four times a day (QID) | ORAL | Status: DC | PRN
  Administered 2023-04-12: 1 via ORAL
  Filled 2023-04-12 (×3): qty 1

## 2023-04-12 MED ORDER — LIDOCAINE 2% (20 MG/ML) 5 ML SYRINGE
INTRAMUSCULAR | Status: DC | PRN
  Administered 2023-04-12: 60 mg via INTRAVENOUS

## 2023-04-12 MED ORDER — THIAMINE MONONITRATE 100 MG PO TABS
100.0000 mg | ORAL_TABLET | Freq: Every day | ORAL | Status: DC
  Administered 2023-04-13 – 2023-04-14 (×2): 100 mg via ORAL
  Filled 2023-04-12 (×2): qty 1

## 2023-04-12 MED ORDER — THROMBIN 5000 UNITS EX SOLR: OROMUCOSAL | Status: DC | PRN

## 2023-04-12 MED ORDER — TIZANIDINE HCL 4 MG PO TABS: 4.0000 mg | ORAL_TABLET | Freq: Three times a day (TID) | ORAL | Status: DC | PRN

## 2023-04-12 MED ORDER — OXYCODONE HCL 5 MG PO TABS: 5.0000 mg | ORAL_TABLET | Freq: Once | ORAL | Status: DC

## 2023-04-12 MED ORDER — DILTIAZEM HCL ER COATED BEADS 180 MG PO CP24
180.0000 mg | ORAL_CAPSULE | Freq: Every day | ORAL | Status: DC
  Administered 2023-04-12 – 2023-04-14 (×3): 180 mg via ORAL
  Filled 2023-04-12 (×3): qty 1

## 2023-04-12 MED ORDER — CEFAZOLIN SODIUM 1 G IJ SOLR
INTRAMUSCULAR | Status: AC
  Filled 2023-04-12: qty 20

## 2023-04-12 MED ORDER — DEXMEDETOMIDINE HCL IN NACL 80 MCG/20ML IV SOLN
INTRAVENOUS | Status: AC
  Filled 2023-04-12: qty 20

## 2023-04-12 MED ORDER — PHENYLEPHRINE HCL-NACL 20-0.9 MG/250ML-% IV SOLN
INTRAVENOUS | Status: DC | PRN
  Administered 2023-04-12: 30 ug/min via INTRAVENOUS

## 2023-04-12 MED ORDER — HYDROMORPHONE HCL 1 MG/ML IJ SOLN: 0.5000 mg | INTRAMUSCULAR | Status: DC | PRN

## 2023-04-12 MED ORDER — CEFAZOLIN SODIUM-DEXTROSE 2-3 GM-%(50ML) IV SOLR
INTRAVENOUS | Status: DC | PRN
  Administered 2023-04-12: 2 g via INTRAVENOUS

## 2023-04-12 MED ORDER — SODIUM CHLORIDE 0.9% FLUSH: 3.0000 mL | INTRAVENOUS | Status: DC | PRN

## 2023-04-12 MED ORDER — OXYCODONE HCL 5 MG/5ML PO SOLN: 5.0000 mg | Freq: Once | ORAL | Status: DC | PRN

## 2023-04-12 MED ORDER — THROMBIN 5000 UNITS EX SOLR
CUTANEOUS | Status: AC
  Filled 2023-04-12: qty 5000

## 2023-04-12 MED ORDER — OMEGA-3-ACID ETHYL ESTERS 1 G PO CAPS
1.0000 g | ORAL_CAPSULE | Freq: Every day | ORAL | Status: DC
  Administered 2023-04-12 – 2023-04-14 (×3): 1 g via ORAL
  Filled 2023-04-12 (×3): qty 1

## 2023-04-12 MED ORDER — ROCURONIUM BROMIDE 10 MG/ML (PF) SYRINGE
PREFILLED_SYRINGE | INTRAVENOUS | Status: DC | PRN
  Administered 2023-04-12: 20 mg via INTRAVENOUS
  Administered 2023-04-12: 10 mg via INTRAVENOUS
  Administered 2023-04-12: 50 mg via INTRAVENOUS

## 2023-04-12 MED ORDER — SIMVASTATIN 20 MG PO TABS: 40.0000 mg | ORAL_TABLET | Freq: Every day | ORAL | Status: DC

## 2023-04-12 MED ORDER — LIDOCAINE 2% (20 MG/ML) 5 ML SYRINGE
INTRAMUSCULAR | Status: AC
  Filled 2023-04-12: qty 20

## 2023-04-12 MED ORDER — SODIUM CHLORIDE 0.9 % IV SOLN: 250.0000 mL | INTRAVENOUS | Status: DC

## 2023-04-12 MED ORDER — IRBESARTAN 75 MG PO TABS
37.5000 mg | ORAL_TABLET | Freq: Every day | ORAL | Status: DC
  Administered 2023-04-12 – 2023-04-14 (×3): 37.5 mg via ORAL
  Filled 2023-04-12 (×3): qty 0.5

## 2023-04-12 SURGICAL SUPPLY — 63 items
ADH SKN CLS APL DERMABOND .7 (GAUZE/BANDAGES/DRESSINGS) ×1
APL SKNCLS STERI-STRIP NONHPOA (GAUZE/BANDAGES/DRESSINGS) ×1
BAG COUNTER SPONGE SURGICOUNT (BAG) ×1 IMPLANT
BAG SPNG CNTER NS LX DISP (BAG) ×1
BASKET BONE COLLECTION (BASKET) ×1 IMPLANT
BENZOIN TINCTURE PRP APPL 2/3 (GAUZE/BANDAGES/DRESSINGS) ×1 IMPLANT
BIT DRILL NEURO 2X3.1 SFT TUCH (MISCELLANEOUS) ×1 IMPLANT
BONE CERV LORDOTIC 14.5X12X8 (Bone Implant) ×2 IMPLANT
BUR MATCHSTICK NEURO 3.0 LAGG (BURR) ×1 IMPLANT
CANISTER SUCT 3000ML PPV (MISCELLANEOUS) ×1 IMPLANT
DERMABOND ADVANCED .7 DNX12 (GAUZE/BANDAGES/DRESSINGS) IMPLANT
DRAIN JP 7F FLT 3/4 PRF SI HBL (DRAIN) IMPLANT
DRAIN RELI 100 BL SUC LF ST (DRAIN) ×1
DRAPE C-ARM 42X72 X-RAY (DRAPES) ×2 IMPLANT
DRAPE LAPAROTOMY 100X72 PEDS (DRAPES) ×1 IMPLANT
DRAPE MICROSCOPE LEICA (MISCELLANEOUS) IMPLANT
DRAPE MICROSCOPE SLANT 54X150 (MISCELLANEOUS) ×1 IMPLANT
DRILL NEURO 2X3.1 SOFT TOUCH (MISCELLANEOUS)
DRSG OPSITE POSTOP 3X4 (GAUZE/BANDAGES/DRESSINGS) IMPLANT
DURAPREP 6ML APPLICATOR 50/CS (WOUND CARE) ×1 IMPLANT
ELECT COATED BLADE 2.86 ST (ELECTRODE) ×1 IMPLANT
ELECT REM PT RETURN 9FT ADLT (ELECTROSURGICAL) ×1
ELECTRODE REM PT RTRN 9FT ADLT (ELECTROSURGICAL) ×1 IMPLANT
EVACUATOR SILICONE 100CC (DRAIN) IMPLANT
GAUZE 4X4 16PLY ~~LOC~~+RFID DBL (SPONGE) IMPLANT
GAUZE SPONGE 4X4 12PLY STRL (GAUZE/BANDAGES/DRESSINGS) ×1 IMPLANT
GLOVE BIO SURGEON STRL SZ7 (GLOVE) IMPLANT
GLOVE BIO SURGEON STRL SZ8 (GLOVE) ×1 IMPLANT
GLOVE BIOGEL PI IND STRL 7.0 (GLOVE) IMPLANT
GLOVE EXAM NITRILE XL STR (GLOVE) IMPLANT
GLOVE INDICATOR 8.5 STRL (GLOVE) ×2 IMPLANT
GOWN STRL REUS W/ TWL LRG LVL3 (GOWN DISPOSABLE) ×1 IMPLANT
GOWN STRL REUS W/ TWL XL LVL3 (GOWN DISPOSABLE) ×1 IMPLANT
GOWN STRL REUS W/TWL 2XL LVL3 (GOWN DISPOSABLE) ×1 IMPLANT
GOWN STRL REUS W/TWL LRG LVL3 (GOWN DISPOSABLE) ×1
GOWN STRL REUS W/TWL XL LVL3 (GOWN DISPOSABLE) ×1
GRAFT BNE SPCR VG2 14.5X12X8 (Bone Implant) IMPLANT
HALTER HD/CHIN CERV TRACTION D (MISCELLANEOUS) ×1 IMPLANT
HEMOSTAT POWDER KIT SURGIFOAM (HEMOSTASIS) ×1 IMPLANT
HEMOSTAT POWDER SURGIFOAM 1G (HEMOSTASIS) IMPLANT
KIT BASIN OR (CUSTOM PROCEDURE TRAY) ×1 IMPLANT
KIT TURNOVER KIT B (KITS) ×1 IMPLANT
NDL HYPO 18GX1.5 BLUNT FILL (NEEDLE) ×1 IMPLANT
NDL SPNL 20GX3.5 QUINCKE YW (NEEDLE) ×1 IMPLANT
NEEDLE HYPO 18GX1.5 BLUNT FILL (NEEDLE) ×1 IMPLANT
NEEDLE SPNL 20GX3.5 QUINCKE YW (NEEDLE) IMPLANT
NS IRRIG 1000ML POUR BTL (IV SOLUTION) ×1 IMPLANT
PACK LAMINECTOMY NEURO (CUSTOM PROCEDURE TRAY) ×1 IMPLANT
PAD ARMBOARD 7.5X6 YLW CONV (MISCELLANEOUS) ×3 IMPLANT
PIN DISTRACTION 14MM (PIN) IMPLANT
PLATE CERV RES 24 2L (Plate) IMPLANT
Resonate Anterior Cervical Plate ×1 IMPLANT
SCREW VA ST 4.6X14 (Screw) IMPLANT
SPIKE FLUID TRANSFER (MISCELLANEOUS) ×1 IMPLANT
SPONGE INTESTINAL PEANUT (DISPOSABLE) ×1 IMPLANT
SPONGE SURGIFOAM ABS GEL SZ50 (HEMOSTASIS) ×1 IMPLANT
STRIP CLOSURE SKIN 1/2X4 (GAUZE/BANDAGES/DRESSINGS) ×1 IMPLANT
SUT VIC AB 3-0 SH 8-18 (SUTURE) ×1 IMPLANT
SUT VICRYL 4-0 PS2 18IN ABS (SUTURE) ×1 IMPLANT
TAPE CLOTH 4X10 WHT NS (GAUZE/BANDAGES/DRESSINGS) ×1 IMPLANT
TOWEL GREEN STERILE (TOWEL DISPOSABLE) ×1 IMPLANT
TOWEL GREEN STERILE FF (TOWEL DISPOSABLE) ×1 IMPLANT
WATER STERILE IRR 1000ML POUR (IV SOLUTION) ×1 IMPLANT

## 2023-04-12 NOTE — Anesthesia Procedure Notes (Signed)
Procedure Name: Intubation Date/Time: 04/12/2023 1:49 PM  Performed by: Alease Medina, CRNAPre-anesthesia Checklist: Patient identified, Emergency Drugs available, Suction available and Patient being monitored Patient Re-evaluated:Patient Re-evaluated prior to induction Oxygen Delivery Method: Circle system utilized Preoxygenation: Pre-oxygenation with 100% oxygen Induction Type: IV induction Ventilation: Mask ventilation without difficulty Laryngoscope Size: Glidescope and 4 Grade View: Grade I Tube type: Oral Tube size: 7.5 mm Number of attempts: 1 Airway Equipment and Method: Stylet and Oral airway Placement Confirmation: ETT inserted through vocal cords under direct vision, positive ETCO2 and breath sounds checked- equal and bilateral Secured at: 23 cm Tube secured with: Tape Dental Injury: Teeth and Oropharynx as per pre-operative assessment

## 2023-04-12 NOTE — Progress Notes (Signed)
PHARMACIST - PHYSICIAN ORDER COMMUNICATION  CONCERNING: P&T Medication Policy on Herbal Medications  DESCRIPTION:  This patient's orders for:  Turmeric and Saw Palmetto  have been noted.  This product(s) is classified as an "herbal" or natural product. Due to a lack of definitive safety studies or FDA approval, nonstandard manufacturing practices, plus the potential risk of unknown drug-drug interactions while on inpatient medications, the Pharmacy and Therapeutics Committee does not permit the use of "herbal" or natural products of this type within [facility].   ACTION TAKEN: The pharmacy department is unable to verify this order at this time per safety policy. Please reevaluate patient's clinical condition at discharge and address if the herbal or natural product(s) should be resumed at that time.   Dennie Fetters, [place] 04/12/2023 5:53 PM

## 2023-04-12 NOTE — Plan of Care (Signed)

## 2023-04-12 NOTE — Progress Notes (Signed)
   04/12/23 1930  Provider Notification  Provider Name/Title Girard Cooter  Date Provider Notified 04/12/23  Time Provider Notified 1930  Method of Notification Page  Notification Reason Other (Comment) (Called to pt's room due to bleeding at surgical site to neck. Blood noted leaking from honeycomb dressing down pts chest, with a collection of blood noted under dressing as well. Dressing reinforced with slight pressure, active bleeding has ceased.)  Provider response Other (Comment) (Continue to monitor and reinforce with pressure as needed. Call back if needed for further intervention.)

## 2023-04-12 NOTE — H&P (Signed)
Sean Austin is an 72 y.o. male.   Chief Complaint: Progressive weakness and ataxia HPI: 72 year old gentleman previously undergone a two-level anterior cervical ketamine fusion less than 2 weeks ago presented with 36 to 48 hours of progressive weakness in his hands difficulty walking.  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  With cord compression.  Previous preoperative MRI scan did show severe cord compression with signal change within his cord I do not see any signal change of the score but certainly the additional cord compression of the epidural hematoma is possible for his decline as he was progressively improving postoperatively initially.  He is currently not any blood thinners only aspirin and due to his progression of clinical syndrome imaging findings of a conservative treatment I recommended reexploration of anterior cervical discectomies with removal of plate removal of allograft evacuation of hematoma and then replacement.  I have extensively gone over the risk and benefits of the operation with him as well as perioperative course expectations of outcome and alternatives of surgery and he understands and agrees to proceed forward.  Past Medical History:  Diagnosis Date   Hypertension     Past Surgical History:  Procedure Laterality Date   ANTERIOR CERVICAL DECOMP/DISCECTOMY FUSION N/A 03/20/2023   Procedure: ANTERIOR CERVICAL DECOMPRESSION/DISCECTOMY FUSION CERVICAL FOUR-FIVE, CERVICAL FIVE-SIX;  Surgeon: Donalee Citrin, MD;  Location: [facility] OR;  Service: Neurosurgery;  Laterality: N/A;    No family history on file. Social History:  reports that he has been smoking cigarettes. He has never used smokeless tobacco. He reports current alcohol use. He reports that he does not use drugs.  Allergies:  Allergies  Allergen Reactions   Amlodipine Swelling   Ace Inhibitors Cough    (Not in a hospital admission)   Results for orders  placed or performed during the hospital encounter of 04/11/23 (from the past 48 hour(s))  CBC with Differential     Status: Abnormal   Collection Time: 04/11/23  8:57 PM  Result Value Ref Range   WBC 5.5 4.0 - 10.5 K/uL   RBC 3.72 (L) 4.22 - 5.81 MIL/uL   Hemoglobin 12.2 (L) 13.0 - 17.0 g/dL   HCT 16.1 (L) 09.6 - 04.5 %   MCV 97.0 80.0 - 100.0 fL   MCH 32.8 26.0 - 34.0 pg   MCHC 33.8 30.0 - 36.0 g/dL   RDW 40.9 81.1 - 91.4 %   Platelets 212 150 - 400 K/uL   nRBC 0.0 0.0 - 0.2 %   Neutrophils Relative % 53 %   Neutro Abs 2.9 1.7 - 7.7 K/uL   Lymphocytes Relative 29 %   Lymphs Abs 1.6 0.7 - 4.0 K/uL   Monocytes Relative 10 %   Monocytes Absolute 0.5 0.1 - 1.0 K/uL   Eosinophils Relative 7 %   Eosinophils Absolute 0.4 0.0 - 0.5 K/uL   Basophils Relative 1 %   Basophils Absolute 0.1 0.0 - 0.1 K/uL   Immature Granulocytes 0 %   Abs Immature Granulocytes 0.01 0.00 - 0.07 K/uL    Comment: Performed at [facility], [address], [place], [place] [address]  Comprehensive metabolic panel     Status: Abnormal   Collection Time: 04/11/23  8:57 PM  Result Value Ref Range   Sodium 135 135 - 145 mmol/L   Potassium 4.2 3.5 - 5.1 mmol/L   Chloride 101 98 - 111 mmol/L   CO2 26 22 - 32 mmol/L  Glucose, Bld 106 (H) 70 - 99 mg/dL    Comment: Glucose reference range applies only to samples taken after fasting for at least 8 hours.   BUN 14 8 - 23 mg/dL   Creatinine, Ser 1.32 0.61 - 1.24 mg/dL   Calcium 9.2 8.9 - 44.0 mg/dL   Total Protein 6.4 (L) 6.5 - 8.1 g/dL   Albumin 3.5 3.5 - 5.0 g/dL   AST 16 15 - 41 U/L   ALT 15 0 - 44 U/L   Alkaline Phosphatase 68 38 - 126 U/L   Total Bilirubin 0.2 (L) 0.3 - 1.2 mg/dL   GFR, Estimated >10 >27 mL/min    Comment: (NOTE) Calculated using the CKD-EPI Creatinine Equation (2021)    Anion gap 8 5 - 15    Comment: Performed at [facility], [address], [place], [place] [address]  Urinalysis, Routine w reflex microscopic -Urine, Clean  Catch     Status: None   Collection Time: 04/12/23  4:33 AM  Result Value Ref Range   Color, Urine YELLOW YELLOW   APPearance CLEAR CLEAR   Specific Gravity, Urine 1.009 1.005 - 1.030   pH 7.0 5.0 - 8.0   Glucose, UA NEGATIVE NEGATIVE mg/dL   Hgb urine dipstick NEGATIVE NEGATIVE   Bilirubin Urine NEGATIVE NEGATIVE   Ketones, ur NEGATIVE NEGATIVE mg/dL   Protein, ur NEGATIVE NEGATIVE mg/dL   Nitrite NEGATIVE NEGATIVE   Leukocytes,Ua NEGATIVE NEGATIVE    Comment: Performed at [facility], [address], [place], [place] [address]   MR BRAIN WO CONTRAST  Result Date: 04/12/2023 CLINICAL DATA:  Neuro deficit, acute, stroke suspected. Weakness, recent neck surgery. EXAM: MRI HEAD WITHOUT CONTRAST TECHNIQUE: Multiplanar, multiecho pulse sequences of the brain and surrounding structures were obtained without intravenous contrast. COMPARISON:  Head MRI 03/20/2023 FINDINGS: Brain: There is no evidence of an acute infarct, mass, midline shift, or extra-axial fluid collection. Chronic microhemorrhages are again seen in the left thalamus, are right external capsule region, and along the body of the left lateral ventricle. T2 hyperintensities in the cerebral white matter and pons are unchanged and nonspecific but compatible with mild chronic small vessel ischemic disease. 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 There is mild generalized cerebral atrophy. Vascular: Major intracranial vascular flow voids are preserved. Skull and upper cervical spine: Unremarkable bone marrow signal. Sinuses/Orbits: Unremarkable orbits. Moderately extensor at similar appearance of moderately extensive mucosal thickening in the paranasal sinuses including mucous retention cysts or polyps in the maxillary sinuses and possible polyps in the nasal cavity. Clear mastoid air cells. Other: None. IMPRESSION: 1. No acute intracranial abnormality. 2. Mild chronic small vessel ischemic disease. Electronically  Signed   By: Sebastian Ache M.D.   On: 04/12/2023 08:37   MR Cervical Spine Wo Contrast  Result Date: 04/12/2023 CLINICAL DATA:  Myelopathy, acute, cervical spine. Worsening weakness, recent neck surgery. EXAM: MRI CERVICAL SPINE WITHOUT CONTRAST TECHNIQUE: Multiplanar, multisequence MR imaging of the cervical spine was performed. No intravenous contrast was administered. COMPARISON:  Cervical spine MRI 03/20/2023 FINDINGS: Alignment: Unchanged trace anterolisthesis of C3 on C4. Vertebrae: Interval C4-C6 ACDF. Marrow STIR hyperintensity/edema in the C4-C6 vertebral bodies. Mild bilateral facet edema at C4-5. No visible fracture. Cord: Persistent T2 hyperintensity in the spinal cord from C3-4 through C5-6, overall mildly decreased from prior and again noted to be greatest at C4-5. Posterior Fossa, vertebral arteries, paraspinal tissues: Small volume prevertebral fluid extending from C2-C6 with maximal AP  thickness of 4 mm at C3-4. Preserved vertebral artery flow voids. Disc levels: C2-3: Severe left facet arthrosis results in mild left neural foraminal stenosis, unchanged. No spinal stenosis. C3-4: Mild-to-moderate disc space narrowing. Anterolisthesis with bulging uncovered disc, a central disc protrusion, uncovertebral spurring, and severe facet arthrosis result in mild spinal stenosis and severe bilateral neural foraminal stenosis, unchanged. C4-5: Interval ACDF. Fluid within the disc space extending into the ventral epidural space, infolding of the ligamentum flavum, uncovertebral spurring, and severe facet arthrosis result in severe spinal stenosis with AP spinal canal diameter of 5 mm (previously 4 mm) and severe right and moderate to severe left neural foraminal stenosis. C5-6: Fluid within the disc space extending into the ventral epidural space, uncovertebral spurring, and mild facet arthrosis result in moderate spinal stenosis with AP spinal canal diameter of 8 mm (previously 7 mm) and severe right and  moderate to severe left neural foraminal stenosis. C6-7: Moderate disc space narrowing. A broad-based posterior disc osteophyte complex and mild facet arthrosis result in mild-to-moderate spinal stenosis and severe bilateral neural foraminal stenosis, unchanged. C7-T1: Mild disc bulging and moderate facet arthrosis result in mild bilateral neural foraminal stenosis without spinal stenosis, unchanged. T1-2: Disc bulging and spurring result in mild bilateral neural foraminal stenosis without spinal stenosis, unchanged. IMPRESSION: 1. Interval C4-C6 ACDF with vertebral marrow edema and fluid in the disc spaces. While this may be postoperative in nature, infection is not excluded. Ventral epidural fluid contributes to severe spinal stenosis at C4-5 and moderate stenosis at C5-6. 2. Slightly decreased spinal cord signal abnormality from C3-4 through C5-6. 3. Severe multilevel neural foraminal stenosis as above. Electronically Signed   By: Sebastian Ache M.D.   On: 04/12/2023 08:32   DG Chest Portable 1 View  Result Date: 04/11/2023 CLINICAL DATA:  Weakness, fatigue, hypertension EXAM: PORTABLE CHEST 1 VIEW COMPARISON:  03/19/2023, 03/21/2023 FINDINGS: 2 frontal views of the chest demonstrate a stable cardiac silhouette. No acute airspace disease, effusion, or pneumothorax. Stable spinal stimulator. No acute bony abnormalities. IMPRESSION: 1. Stable chest, no acute process. Electronically Signed   By: Sharlet Salina M.D.   On: 04/11/2023 20:49    Review of Systems  Musculoskeletal:  Positive for neck pain.  Neurological:  Positive for weakness and numbness.    Blood pressure (!) 168/88, pulse 74, temperature 97.9 F (36.6 C), temperature source Oral, resp. rate 16, weight 83.9 kg, SpO2 98%. Physical Exam HENT:     Head: Normocephalic.     Nose: Nose normal.  Eyes:     Pupils: Pupils are equal, round, and reactive to light.  Cardiovascular:     Rate and Rhythm: Normal rate.  Pulmonary:     Effort:  Pulmonary effort is normal.  Abdominal:     General: Abdomen is flat.  Musculoskeletal:        General: Normal range of motion.  Skin:    General: Skin is warm.  Neurological:     Mental Status: He is alert.     Comments: Patient is awake and alert strength significant weakness in his hands bilaterally 4 out of 5 as well as triceps 4 to 4+ out of 5      Assessment/Plan 72 year old presents for anterior cervical discectomy and fusion reexploration  Mariam Dollar, MD 04/12/2023, 9:45 AM

## 2023-04-12 NOTE — ED Provider Notes (Signed)
Patient seen after prior ED providers.  MR imaging and patient evaluation communicated to neurosurgery.    Neurosurgery has seen in the ED.  Neurosurgery plans to take patient to the OR later today.     Wynetta Fines, MD 04/12/23 [phone]

## 2023-04-12 NOTE — Transfer of Care (Signed)
Immediate Anesthesia Transfer of Care Note  Patient: Sean Austin  Procedure(s) Performed: RE-EXPLORATION ANTERIOR CERVICAL DECOMPRESSION FUSION (Neck)  Patient Location: PACU  Anesthesia Type:General  Level of Consciousness: awake and alert   Airway & Oxygen Therapy: Patient Spontanous Breathing and Patient connected to nasal cannula oxygen  Post-op Assessment: Report given to RN and Post -op Vital signs reviewed and stable  Post vital signs: Reviewed and stable  Last Vitals:  Vitals Value Taken Time  BP 143/87 04/12/23 1516  Temp    Pulse 85 04/12/23 1519  Resp 14 04/12/23 1519  SpO2 95 % 04/12/23 1519  Vitals shown include unfiled device data.  Last Pain:  Vitals:   04/12/23 1037  TempSrc:   PainSc: 3          Complications: No notable events documented.

## 2023-04-12 NOTE — Op Note (Signed)
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  Postoperative diagnosis: Same.  Procedure: Reexploration of anterior cervical discectomy fusion with removal of hardware removal of allograft and evacuation of epidural hematoma and phlegmon.  2.  Redo anterior cervical fusion with placement of new 8 mm allograft spacers and a new 34 mm globus resonate plate and 6 new rescue screws.  Surgeon: Jillyn Hidden Tikita Mabee  Assistant: Julien Girt.  Anesthesia: General.  EBL: Less than 100.  HPI: 72 year old gentleman had a two-level anterior cervical done about 2 weeks ago presented to the ER with 36 to 48 hours of progressive weakness in his arms and legs.  Workup revealed significant epidural fluid collections behind the implants at C4-5 and C5-6.  Due to patient's progression of clinical syndrome imaging findings of a conservative treatment I recommended reexploration of his anterior fusion with removal of implants and evacuation of epidural hematoma.  I extensively went over the risks and benefits of the operation with him as well as perioperative course expectations of outcome and alternatives to surgery and he understood and agreed to proceed forward.  Operative procedure: Patient was brought into the OR was induced under general anesthesia positioned supine the neck in slight extension 5 pounds all traction.  The right side of his neck was prepped and draped in routine sterile fashion his old incision was opened up the platysma was divided and the avascular plane between the strap muscles and the sternocleidomastoid were developed medially exposing the anterior cervical plate.  Self-retaining retractor was placed plate was removed implants were removed and there was an extensive mount of phlegmon organized clot causing severe cord compression at both levels.  First working at C5-6 this was all evacuated till I saw the native dura  aggressively utilizing a nerve hook behind the endplates behind the vertebral bodies and and evacuating all the compressive clot.  Dura was visualized both C7 nerve roots were identified and skeletonized.  Then attention taken at C4-5 in a similar fashion extensive mount of organized clot was all removed with a nerve hook and a 2 Miller Kerrison punch.  There was some clear fluid in this area with did not identify a spinal fluid leak at all felt this was mostly broken down seromatous blood trapped behind some of the phlegmon.  But decompress it removed to palpate underneath the vertebral bodies to ensure adequate decompression and then meticulous hemostasis was maintained to new allograft 1 size bigger to 8 mm graft were inserted then and 34 mm globus resonate plate was placed all screws had excellent purchase locking mechanism was engaged wound was then copiously irrigated JP drain was placed and the wound was closed in layers with 0 Vicryl in a potassium and a running 4 subcuticular.  Dermabond benzoin Steri-Strips and a sterile dressing was applied patient recovery in stable condition.  At the end the case all needle count sponge counts were correct.

## 2023-04-12 NOTE — Anesthesia Preprocedure Evaluation (Addendum)
Anesthesia Evaluation  Patient identified by MRN, date of birth, ID band Patient awake    Reviewed: Allergy & Precautions, NPO status , Patient's Chart, lab work & pertinent test results  History of Anesthesia Complications Negative for: history of anesthetic complications  Airway Mallampati: II  TM Distance: >3 FB Neck ROM: Limited    Dental  (+) Dental Advisory Given, Poor Dentition, Chipped   Pulmonary Current Smoker and Patient abstained from smoking.   Pulmonary exam normal        Cardiovascular hypertension, Pt. on medications Normal cardiovascular exam     Neuro/Psych  negative psych ROS   GI/Hepatic negative GI ROS, Neg liver ROS,,,  Endo/Other  negative endocrine ROS    Renal/GU negative Renal ROS     Musculoskeletal negative musculoskeletal ROS (+)    Abdominal   Peds  Hematology  (+) Blood dyscrasia, anemia   Anesthesia Other Findings   Reproductive/Obstetrics                             Anesthesia Physical Anesthesia Plan  ASA: 2  Anesthesia Plan: General   Post-op Pain Management: Tylenol PO (pre-op)*   Induction: Intravenous  PONV Risk Score and Plan: 1 and Treatment may vary due to age or medical condition, Ondansetron and Dexamethasone  Airway Management Planned: Oral ETT and Video Laryngoscope Planned  Additional Equipment: None  Intra-op Plan:   Post-operative Plan: Extubation in OR  Informed Consent: I have reviewed the patients History and Physical, chart, labs and discussed the procedure including the risks, benefits and alternatives for the proposed anesthesia with the patient or authorized representative who has indicated his/her understanding and acceptance.     Dental advisory given  Plan Discussed with: CRNA and Anesthesiologist  Anesthesia Plan Comments:        Anesthesia Quick Evaluation

## 2023-04-13 NOTE — Progress Notes (Signed)
    Providing Compassionate, Quality Care - Together   NEUROSURGERY PROGRESS NOTE     S: Some drainage from incision. Pt accidentally pulled out hemovac o/n.    O: EXAM:  BP (!) 151/79 (BP Location: Right Arm)   Pulse 82   Temp 98 F (36.7 C) (Oral)   Resp 16   Wt 83.9 kg   SpO2 92%   BMI 24.41 kg/m     Awake, alert, oriented  Speech fluent, appropriate  BUE/BLE 4/5 SILTx4 Dressing intact, bloody drainage to inferior portion.    ASSESSMENT:  72 y.o. s/p exploration and redo of ACDF C4-5, 5-6 for epidural hematoma, POD#1    PLAN: -PT/OT as tolerated -Call w/ questions/concerns.   Patrici Ranks, [facility]

## 2023-04-13 NOTE — Evaluation (Signed)
Occupational Therapy Evaluation Patient Details Name: Sean Austin MRN: [id number] DOB: Aug 13, 1951 Today's Date: 04/13/2023   History of Present Illness Pt is a 72 y.o. 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 S/p redo of ACDF C4-5, 5-6 8/30. Hx: 8/7, pt underwent anterior cervical discectomies and fusion at C4-5 C5-6. PMH: hypertension, EtOH use low back pain status post spinal cord stimulator placed in 2022.   Clinical Impression   Pt is at Sup - mod I with ADLs/selfcare and mobility using RW. Pt provided with cervical precautions handout and verbalized understanding. PTA pt lived at home with his wife and was Ind with ADLs, mobility, and was driving. Pt also educated on ADL A/E for home use. All education completed and no further acute OT services are indicated at this time      If plan is discharge home, recommend the following: Assist for transportation;A little help with bathing/dressing/bathroom;A little help with walking and/or transfers    Functional Status Assessment  Patient has not had a recent decline in their functional status  Equipment Recommendations  None recommended by OT    Recommendations for Other Services       Precautions / Restrictions Precautions Precautions: Cervical;Fall Precaution Comments: Reviewed precautions, handout provided Restrictions Weight Bearing Restrictions: No      Mobility Bed Mobility               General bed mobility comments: pt in recliner upon arrival    Transfers Overall transfer level: Needs assistance     Sit to Stand: Supervision                  Balance Overall balance assessment: Needs assistance Sitting-balance support: No upper extremity supported, Feet supported Sitting balance-Leahy Scale: Good     Standing balance support: No upper extremity supported Standing balance-Leahy Scale: Fair                              ADL either performed or assessed with clinical judgement   ADL Overall ADL's : Modified independent                                       General ADL Comments: Mod I - Sup with ADLs, Sup with toilet and shower transfers     Vision Baseline Vision/History: 1 Wears glasses Ability to See in Adequate Light: 0 Adequate Patient Visual Report: No change from baseline       Perception         Praxis         Pertinent Vitals/Pain Pain Assessment Pain Assessment: No/denies pain Pain Score: 0-No pain Pain Intervention(s): Monitored during session     Extremity/Trunk Assessment Upper Extremity Assessment Upper Extremity Assessment: Overall WFL for tasks assessed   Lower Extremity Assessment Lower Extremity Assessment: Defer to PT evaluation   Cervical / Trunk Assessment Cervical / Trunk Assessment: Neck Surgery   Communication Communication Communication: No apparent difficulties   Cognition Arousal: Alert Behavior During Therapy: WFL for tasks assessed/performed Overall Cognitive Status: Within Functional Limits for tasks assessed                                       General Comments  Requires  cues for precautions throughout.    Exercises     Shoulder Instructions      Home Living Family/patient expects to be discharged to:: Private residence Living Arrangements: Spouse/significant other;Children Available Help at Discharge: Family;Available 24 hours/day;Friend(s) Type of Home: House Home Access: Stairs to enter [employer] of Steps: 4 Entrance Stairs-Rails: Left Home Layout: One level     Bathroom Shower/Tub: Producer, television/film/video: Handicapped height     Home Equipment: Shower seat;Grab bars - tub/shower;Hand held Armed forces logistics/support/administrative officer (2 wheels);Wheelchair - manual          Prior Functioning/Environment Prior Level of Function : Independent/Modified Independent              Mobility Comments: was progressing with HHPT after prior surgery. Still using RW but progressing away from use until symptoms increased ADLs Comments: Independent with ADLs and IADLs and drives        OT Problem List: Decreased activity tolerance;Decreased knowledge of use of DME or AE      OT Treatment/Interventions: Self-care/ADL training;DME and/or AE instruction;Therapeutic activities;Patient/family education    OT Goals(Current goals can be found in the care plan section) Acute Rehab OT Goals Patient Stated Goal: go home  OT Frequency:      Co-evaluation              AM-PAC OT "6 Clicks" Daily Activity     Outcome Measure Help from another person eating meals?: None Help from another person taking care of personal grooming?: None Help from another person toileting, which includes using toliet, bedpan, or urinal?: None Help from another person bathing (including washing, rinsing, drying)?: None Help from another person to put on and taking off regular upper body clothing?: None Help from another person to put on and taking off regular lower body clothing?: None 6 Click Score: 24   End of Session Equipment Utilized During Treatment: Rolling walker (2 wheels);Gait belt  Activity Tolerance: Patient tolerated treatment well Patient left: with call bell/phone within reach;in chair;with chair alarm set  OT Visit Diagnosis: Unsteadiness on feet (R26.81);Other abnormalities of gait and mobility (R26.89)                Time: 3664-4034 OT Time Calculation (min): 21 min Charges:  OT General Charges $OT Visit: 1 Visit OT Evaluation $OT Eval Low Complexity: 1 Low    Galen Manila 04/13/2023, 1:04 PM

## 2023-04-13 NOTE — Plan of Care (Signed)
  Problem: Education: Goal: Knowledge of General Education information will improve Description Including pain rating scale, medication(s)/side effects and non-pharmacologic comfort measures Outcome: Progressing   Problem: Health Behavior/Discharge Planning: Goal: Ability to manage health-related needs will improve Outcome: Progressing   

## 2023-04-13 NOTE — Plan of Care (Signed)

## 2023-04-13 NOTE — Progress Notes (Signed)
   04/13/23 0000  Vitals  ECG Heart Rate 88  Resp 12  MEWS COLOR  MEWS Score Color Green  MEWS Score  MEWS Temp 0  MEWS Systolic 0  MEWS Pulse 0  MEWS RR 1  MEWS LOC 0  MEWS Score 1  Provider Notification  Provider Name/Title Claudean Severance  Date Provider Notified 04/13/23  Time Provider Notified 0000  Method of Notification Page  Notification Reason Other (Comment) (JP drain noted lying in bed, detached from incisional site. Small a\mount of blood collection noted at inferior part of dressing, no active bleeding noted at this time. Will contiue to moinitor and reinforce as needed.)  Provider response Other (Comment) (Monitor and reinforce as needed.)  Date of Provider Response 04/13/23  Time of Provider Response 0005

## 2023-04-13 NOTE — Progress Notes (Signed)
Physical Therapy Evaluation Patient Details Name: Sean Austin MRN: [id number] DOB: 11-06-1950 Today's Date: 04/13/2023  History of Present Illness  Pt is a 72 y.o. 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 S/p redo of ACDF C4-5, 5-6 8/30. Hx: 8/7, pt underwent anterior cervical discectomies and fusion at C4-5 C5-6. PMH: hypertension, EtOH use low back pain status post spinal cord stimulator placed in 2022.  Clinical Impression  Patient is s/p above surgery resulting in functional limitations due to the deficits listed below (see PT Problem List). Pt was making steady progress with HHPT prior to this second admission for hematoma, ambulating with and without RW. Currently requires supervision to  CGA and use of RW at all times to safely mobilize. Demonstrates some reduced LE control while ambulating but able to adequately support himself without overtly buckling (Lt knee a bit more unstable than Rt.) Safely navigates stairs similar to home set-up. Feels confident at current level to return home with wife support. Eager to continue with HHPT. Okay for d/c from mobility standpoint; reviewed precautions, safety, and RW use at all times upon d/c. He has needed equipment already.  Patient will benefit from acute skilled PT to increase their independence and safety with mobility to facilitate discharge.         If plan is discharge home, recommend the following: A little help with walking and/or transfers;Assist for transportation;Help with stairs or ramp for entrance;Assistance with cooking/housework;A little help with bathing/dressing/bathroom   Can travel by private vehicle     yes    Equipment Recommendations None recommended by PT     Functional Status Assessment Patient has had a recent decline in their functional status and demonstrates the ability to make significant improvements in function in a reasonable and  predictable amount of time.     Precautions / Restrictions Precautions Precautions: Cervical;Fall Precaution Comments: Reviewed precautions Restrictions Weight Bearing Restrictions: No      Mobility  Bed Mobility Overal bed mobility: Needs Assistance Bed Mobility: Rolling, Sidelying to Sit Rolling: Supervision Sidelying to sit: Supervision       General bed mobility comments: Educated on log roll technique and management of cervical precautions with bed mobility. Able to mobilize without physical assist, VC only for technique today.    Transfers Overall transfer level: Needs assistance Equipment used: Rolling walker (2 wheels) Transfers: Sit to/from Stand Sit to Stand: Supervision           General transfer comment: Supervision for safety, cues for technique and hand placement. Good power up. RW for support, minor instability, supervision for safety. Performed twice from bed. Fair control with descent.    Ambulation/Gait Ambulation/Gait assistance: Contact guard assist Gait Distance (Feet): 250 Feet Assistive device: Rolling walker (2 wheels) Gait Pattern/deviations: Step-through pattern, Decreased step length - right, Decreased step length - left, Decreased stride length, Knees buckling, Knee hyperextension - left, Knee flexed in stance - right, Knee flexed in stance - left, Ataxic Gait velocity: decr Gait velocity interpretation: <1.31 ft/sec, indicative of household ambulator Pre-gait activities: weight shift and march General Gait Details: Demonstrates minor instability with gait, requires RW at all times to ambulate safely. Educated on use, safety and awareness. Lt knee unsteady at times, hyperextending intermittently. No complete buckle however.  Stairs Stairs: Yes Stairs assistance: Contact guard assist Stair Management: One rail Left, Step to pattern, Sideways Number of Stairs: 2 General stair comments: Educated on technique. Safely performed at [facility] level,  cues for sequencing, able to teach back accurately. Feels confident, declines further practice.  Wheelchair Mobility     Tilt Bed    Modified Rankin (Stroke Patients Only)       Balance Overall balance assessment: Needs assistance Sitting-balance support: No upper extremity supported, Feet supported Sitting balance-Leahy Scale: Good     Standing balance support: No upper extremity supported Standing balance-Leahy Scale: Fair Standing balance comment: Able to stand with CGA. No UE support.                             Pertinent Vitals/Pain Pain Assessment Pain Assessment: No/denies pain    Home Living Family/patient expects to be discharged to:: Private residence Living Arrangements: Spouse/significant other;Children Available Help at Discharge: Family;Available 24 hours/day;Friend(s) Type of Home: House Home Access: Stairs to enter Entrance Stairs-Rails: Left Entrance Stairs-Number of Steps: 4   Home Layout: One level Home Equipment: Shower seat;Grab bars - tub/shower;Hand held Armed forces logistics/support/administrative officer (2 wheels);Wheelchair - manual      Prior Function Prior Level of Function : Independent/Modified Independent             Mobility Comments: was progressing with HHPT after prior surgery. Still using RW but progressing away from use until symptoms increased ADLs Comments: Independent with ADLs and IADLs and drives     Extremity/Trunk Assessment   Upper Extremity Assessment Upper Extremity Assessment: Defer to OT evaluation    Lower Extremity Assessment Lower Extremity Assessment: Generalized weakness       Communication   Communication Communication: No apparent difficulties  Cognition Arousal: Alert Behavior During Therapy: WFL for tasks assessed/performed Overall Cognitive Status: Within Functional Limits for tasks assessed                                 General Comments: Needs cues for precautions  periodically        General Comments General comments (skin integrity, edema, etc.): Requires cues for precautions throughout.    Exercises     Assessment/Plan    PT Assessment    PT Problem List         PT Treatment Interventions      PT Goals (Current goals can be found in the Care Plan section)  Acute Rehab PT Goals Patient Stated Goal: To go home and eventually go to OPPT PT Goal Formulation: With patient Time For Goal Achievement: 04/19/23 Potential to Achieve Goals: Good    Frequency Min 1X/week     Co-evaluation               AM-PAC PT "6 Clicks" Mobility  Outcome Measure Help needed turning from your back to your side while in a flat bed without using bedrails?: A Little Help needed moving from lying on your back to sitting on the side of a flat bed without using bedrails?: A Little Help needed moving to and from a bed to a chair (including a wheelchair)?: A Little Help needed standing up from a chair using your arms (e.g., wheelchair or bedside chair)?: A Little Help needed to walk in hospital room?: A Little Help needed climbing 3-5 steps with a railing? : A Little 6 Click Score: 18    End of Session Equipment Utilized During Treatment: Gait belt Activity Tolerance: Patient tolerated treatment well Patient left: with call bell/phone within reach;in chair;with chair alarm set;with SCD's reapplied Nurse Communication: Mobility  status PT Visit Diagnosis: Unsteadiness on feet (R26.81);Other abnormalities of gait and mobility (R26.89)    Time: 1610-9604 PT Time Calculation (min) (ACUTE ONLY): 31 min   Charges:   PT Evaluation $PT Eval Low Complexity: 1 Low PT Treatments $Gait Training: 8-22 mins PT General Charges $$ ACUTE PT VISIT: 1 Visit         Kathlyn Sacramento, PT, DPT [facility]  Rehabilitation Services Physical Therapist Office: [phone] Website: [email or web address]   Berton Mount 04/13/2023, 11:07 AM

## 2023-04-13 NOTE — Anesthesia Postprocedure Evaluation (Signed)
Anesthesia Post Note  Patient: Sean Austin  Procedure(s) Performed: RE-EXPLORATION ANTERIOR CERVICAL DECOMPRESSION FUSION (Neck)     Patient location during evaluation: PACU Anesthesia Type: General Level of consciousness: awake and alert Pain management: pain level controlled Vital Signs Assessment: post-procedure vital signs reviewed and stable Respiratory status: spontaneous breathing, nonlabored ventilation, respiratory function stable and patient connected to nasal cannula oxygen Cardiovascular status: blood pressure returned to baseline and stable Postop Assessment: no apparent nausea or vomiting Anesthetic complications: no   No notable events documented.  Last Vitals:  Vitals:   04/13/23 0000 04/13/23 0411  BP:  124/80  Pulse:  75  Resp: 12 16  Temp:  36.9 C  SpO2:  95%    Last Pain:  Vitals:   04/13/23 0411  TempSrc: Oral  PainSc: 0-No pain   Pain Goal: Patients Stated Pain Goal: 3 (04/12/23 2020)                 Collene Schlichter

## 2023-04-14 ENCOUNTER — Encounter (HOSPITAL_COMMUNITY): Payer: Self-pay | Admitting: Neurosurgery

## 2023-04-14 MED ORDER — PNEUMOCOCCAL 20-VAL CONJ VACC 0.5 ML IM SUSY
0.5000 mL | PREFILLED_SYRINGE | Freq: Once | INTRAMUSCULAR | Status: AC
  Administered 2023-04-14: 0.5 mL via INTRAMUSCULAR
  Filled 2023-04-14: qty 0.5

## 2023-04-14 MED ORDER — HYDROCODONE-ACETAMINOPHEN 5-325 MG PO TABS: 1.0000 | ORAL_TABLET | Freq: Four times a day (QID) | ORAL | 0 refills | Status: DC | PRN

## 2023-04-14 MED ORDER — HYDROCODONE-ACETAMINOPHEN 5-325 MG PO TABS: 1.0000 | ORAL_TABLET | Freq: Four times a day (QID) | ORAL | 0 refills | Status: AC | PRN

## 2023-04-14 NOTE — Plan of Care (Signed)

## 2023-04-14 NOTE — Progress Notes (Signed)
DC order in, patient says wife will be here around 1200 to pick him up.

## 2023-04-14 NOTE — Discharge Summary (Signed)
Physician Discharge Summary  Patient ID: Sean Austin MRN: [id number] DOB/AGE: [id number] 71 y.o.  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  Patient taken to the operating room where he underwent uncomplicated reexploration of his anterior cervical fusion with removal of epidural hematoma.  Postoperatively the patient has done well.  His neck pain and upper extremity and lower extremity weakness are much improved.  He is standing ambulating and voiding.  He is swallowing well.  He is ready for discharge home.  Consults:   Significant Diagnostic Studies:   Treatments:  Discharge Exam: Blood pressure (!) 165/86, pulse 69, temperature 98 F (36.7 C), temperature source Oral, resp. rate 16, weight 83.9 kg, SpO2 94%. Awake and alert.  Oriented and appropriate.  Motor and sensory function intact.  Wound clean and dry.  Chest and abdomen benign.  Disposition: Discharge disposition: 01-Home or Self Care        Allergies as of 04/14/2023       Reactions   Amlodipine Swelling   Ace Inhibitors Cough        Medication List     TAKE these medications    aspirin 81 MG chewable tablet Chew 81 mg by mouth daily.   diltiazem 180 MG 24 hr capsule Commonly known as: DILACOR XR Take 180 mg by mouth daily.   folic acid 400 MCG tablet Commonly known as: FOLVITE Take 400 mcg by mouth daily.   HYDROcodone-acetaminophen 5-325 MG tablet Commonly known as: NORCO/VICODIN Take 1 tablet by mouth every 6 (six) hours as needed for moderate pain.   KP Omega-3 Fish Oil 1200 MG Cpdr Take 1 capsule by mouth daily.   multivitamin with minerals Tabs  tablet Take 1 tablet by mouth daily.   olmesartan 40 MG tablet Commonly known as: BENICAR Take 40 mg by mouth daily.   pantoprazole 40 MG tablet Commonly known as: Protonix Take 1 tablet (40 mg total) by mouth daily.   SAW PALMETTO PO Take 1 capsule by mouth daily. 320 mg.   simvastatin 40 MG tablet Commonly known as: ZOCOR Take 40 mg by mouth daily.   tamsulosin 0.4 MG Caps capsule Commonly known as: FLOMAX Take 0.8 mg by mouth daily.   thiamine 100 MG tablet Commonly known as: VITAMIN B1 Take 1 tablet (100 mg total) by mouth daily.   tiZANidine 4 MG tablet Commonly known as: ZANAFLEX Take 4 mg by mouth every 8 (eight) hours as needed for muscle spasms.   TURMERIC PO Take 500 mg by mouth every evening.         Signed: Kathaleen Maser Kania Regnier 04/14/2023, 10:16 AM

## 2023-04-14 NOTE — Discharge Instructions (Signed)

## 2023-04-16 MED FILL — Thrombin For Soln 5000 Unit: CUTANEOUS | Qty: 5000 | Status: AC

## 2023-04-17 ENCOUNTER — Encounter (HOSPITAL_COMMUNITY): Payer: Self-pay | Admitting: Neurosurgery

## 2023-04-18 DIAGNOSIS — I700 Atherosclerosis of aorta: Secondary | ICD-10-CM | POA: Diagnosis not present

## 2023-04-18 DIAGNOSIS — J439 Emphysema, unspecified: Secondary | ICD-10-CM | POA: Diagnosis not present

## 2023-04-18 DIAGNOSIS — Z4789 Encounter for other orthopedic aftercare: Secondary | ICD-10-CM | POA: Diagnosis not present

## 2023-04-18 DIAGNOSIS — Z981 Arthrodesis status: Secondary | ICD-10-CM | POA: Diagnosis not present

## 2023-04-18 DIAGNOSIS — R200 Anesthesia of skin: Secondary | ICD-10-CM | POA: Diagnosis not present

## 2023-04-18 DIAGNOSIS — I10 Essential (primary) hypertension: Secondary | ICD-10-CM | POA: Diagnosis not present

## 2023-04-24 DIAGNOSIS — R200 Anesthesia of skin: Secondary | ICD-10-CM | POA: Diagnosis not present

## 2023-04-24 DIAGNOSIS — Z7982 Long term (current) use of aspirin: Secondary | ICD-10-CM | POA: Diagnosis not present

## 2023-04-24 DIAGNOSIS — Z981 Arthrodesis status: Secondary | ICD-10-CM | POA: Diagnosis not present

## 2023-04-24 DIAGNOSIS — Z4789 Encounter for other orthopedic aftercare: Secondary | ICD-10-CM | POA: Diagnosis not present

## 2023-04-24 DIAGNOSIS — J439 Emphysema, unspecified: Secondary | ICD-10-CM | POA: Diagnosis not present

## 2023-04-24 DIAGNOSIS — I10 Essential (primary) hypertension: Secondary | ICD-10-CM | POA: Diagnosis not present

## 2023-04-24 DIAGNOSIS — I700 Atherosclerosis of aorta: Secondary | ICD-10-CM | POA: Diagnosis not present

## 2023-04-24 DIAGNOSIS — F1090 Alcohol use, unspecified, uncomplicated: Secondary | ICD-10-CM | POA: Diagnosis not present

## 2023-04-24 DIAGNOSIS — Z87891 Personal history of nicotine dependence: Secondary | ICD-10-CM | POA: Diagnosis not present

## 2023-04-25 DIAGNOSIS — I700 Atherosclerosis of aorta: Secondary | ICD-10-CM | POA: Diagnosis not present

## 2023-04-25 DIAGNOSIS — I10 Essential (primary) hypertension: Secondary | ICD-10-CM | POA: Diagnosis not present

## 2023-04-25 DIAGNOSIS — J439 Emphysema, unspecified: Secondary | ICD-10-CM | POA: Diagnosis not present

## 2023-04-25 DIAGNOSIS — R200 Anesthesia of skin: Secondary | ICD-10-CM | POA: Diagnosis not present

## 2023-04-25 DIAGNOSIS — Z981 Arthrodesis status: Secondary | ICD-10-CM | POA: Diagnosis not present

## 2023-04-25 DIAGNOSIS — Z4789 Encounter for other orthopedic aftercare: Secondary | ICD-10-CM | POA: Diagnosis not present

## 2023-04-29 DIAGNOSIS — J439 Emphysema, unspecified: Secondary | ICD-10-CM | POA: Diagnosis not present

## 2023-04-29 DIAGNOSIS — Z4789 Encounter for other orthopedic aftercare: Secondary | ICD-10-CM | POA: Diagnosis not present

## 2023-04-29 DIAGNOSIS — R200 Anesthesia of skin: Secondary | ICD-10-CM | POA: Diagnosis not present

## 2023-04-29 DIAGNOSIS — I700 Atherosclerosis of aorta: Secondary | ICD-10-CM | POA: Diagnosis not present

## 2023-04-29 DIAGNOSIS — Z981 Arthrodesis status: Secondary | ICD-10-CM | POA: Diagnosis not present

## 2023-04-29 DIAGNOSIS — I10 Essential (primary) hypertension: Secondary | ICD-10-CM | POA: Diagnosis not present

## 2023-05-01 DIAGNOSIS — Z981 Arthrodesis status: Secondary | ICD-10-CM | POA: Diagnosis not present

## 2023-05-01 DIAGNOSIS — Z4789 Encounter for other orthopedic aftercare: Secondary | ICD-10-CM | POA: Diagnosis not present

## 2023-05-01 DIAGNOSIS — J439 Emphysema, unspecified: Secondary | ICD-10-CM | POA: Diagnosis not present

## 2023-05-01 DIAGNOSIS — I10 Essential (primary) hypertension: Secondary | ICD-10-CM | POA: Diagnosis not present

## 2023-05-01 DIAGNOSIS — R200 Anesthesia of skin: Secondary | ICD-10-CM | POA: Diagnosis not present

## 2023-05-01 DIAGNOSIS — I700 Atherosclerosis of aorta: Secondary | ICD-10-CM | POA: Diagnosis not present

## 2023-05-02 DIAGNOSIS — G959 Disease of spinal cord, unspecified: Secondary | ICD-10-CM | POA: Diagnosis not present

## 2023-05-07 DIAGNOSIS — I700 Atherosclerosis of aorta: Secondary | ICD-10-CM | POA: Diagnosis not present

## 2023-05-07 DIAGNOSIS — I10 Essential (primary) hypertension: Secondary | ICD-10-CM | POA: Diagnosis not present

## 2023-05-07 DIAGNOSIS — J439 Emphysema, unspecified: Secondary | ICD-10-CM | POA: Diagnosis not present

## 2023-05-07 DIAGNOSIS — Z981 Arthrodesis status: Secondary | ICD-10-CM | POA: Diagnosis not present

## 2023-05-07 DIAGNOSIS — R200 Anesthesia of skin: Secondary | ICD-10-CM | POA: Diagnosis not present

## 2023-05-07 DIAGNOSIS — Z4789 Encounter for other orthopedic aftercare: Secondary | ICD-10-CM | POA: Diagnosis not present

## 2023-05-09 DIAGNOSIS — I700 Atherosclerosis of aorta: Secondary | ICD-10-CM | POA: Diagnosis not present

## 2023-05-09 DIAGNOSIS — J439 Emphysema, unspecified: Secondary | ICD-10-CM | POA: Diagnosis not present

## 2023-05-09 DIAGNOSIS — R200 Anesthesia of skin: Secondary | ICD-10-CM | POA: Diagnosis not present

## 2023-05-09 DIAGNOSIS — Z4789 Encounter for other orthopedic aftercare: Secondary | ICD-10-CM | POA: Diagnosis not present

## 2023-05-09 DIAGNOSIS — Z981 Arthrodesis status: Secondary | ICD-10-CM | POA: Diagnosis not present

## 2023-05-09 DIAGNOSIS — I10 Essential (primary) hypertension: Secondary | ICD-10-CM | POA: Diagnosis not present

## 2023-05-13 DIAGNOSIS — J439 Emphysema, unspecified: Secondary | ICD-10-CM | POA: Diagnosis not present

## 2023-05-13 DIAGNOSIS — Z4789 Encounter for other orthopedic aftercare: Secondary | ICD-10-CM | POA: Diagnosis not present

## 2023-05-13 DIAGNOSIS — I10 Essential (primary) hypertension: Secondary | ICD-10-CM | POA: Diagnosis not present

## 2023-05-13 DIAGNOSIS — I700 Atherosclerosis of aorta: Secondary | ICD-10-CM | POA: Diagnosis not present

## 2023-05-13 DIAGNOSIS — Z981 Arthrodesis status: Secondary | ICD-10-CM | POA: Diagnosis not present

## 2023-05-13 DIAGNOSIS — R200 Anesthesia of skin: Secondary | ICD-10-CM | POA: Diagnosis not present

## 2023-05-20 DIAGNOSIS — Z4789 Encounter for other orthopedic aftercare: Secondary | ICD-10-CM | POA: Diagnosis not present

## 2023-05-20 DIAGNOSIS — I10 Essential (primary) hypertension: Secondary | ICD-10-CM | POA: Diagnosis not present

## 2023-05-20 DIAGNOSIS — J439 Emphysema, unspecified: Secondary | ICD-10-CM | POA: Diagnosis not present

## 2023-05-20 DIAGNOSIS — Z981 Arthrodesis status: Secondary | ICD-10-CM | POA: Diagnosis not present

## 2023-05-20 DIAGNOSIS — R200 Anesthesia of skin: Secondary | ICD-10-CM | POA: Diagnosis not present

## 2023-05-20 DIAGNOSIS — I700 Atherosclerosis of aorta: Secondary | ICD-10-CM | POA: Diagnosis not present

## 2023-05-30 DIAGNOSIS — I714 Abdominal aortic aneurysm, without rupture, unspecified: Secondary | ICD-10-CM | POA: Diagnosis not present

## 2023-05-30 DIAGNOSIS — E782 Mixed hyperlipidemia: Secondary | ICD-10-CM | POA: Diagnosis not present

## 2023-05-30 DIAGNOSIS — G9520 Unspecified cord compression: Secondary | ICD-10-CM | POA: Diagnosis not present

## 2023-05-30 DIAGNOSIS — J440 Chronic obstructive pulmonary disease with acute lower respiratory infection: Secondary | ICD-10-CM | POA: Diagnosis not present

## 2023-05-30 DIAGNOSIS — R7301 Impaired fasting glucose: Secondary | ICD-10-CM | POA: Diagnosis not present

## 2023-05-30 DIAGNOSIS — F17200 Nicotine dependence, unspecified, uncomplicated: Secondary | ICD-10-CM | POA: Diagnosis not present

## 2023-05-30 DIAGNOSIS — Z0000 Encounter for general adult medical examination without abnormal findings: Secondary | ICD-10-CM | POA: Diagnosis not present

## 2023-05-30 DIAGNOSIS — N400 Enlarged prostate without lower urinary tract symptoms: Secondary | ICD-10-CM | POA: Diagnosis not present

## 2023-05-30 DIAGNOSIS — I129 Hypertensive chronic kidney disease with stage 1 through stage 4 chronic kidney disease, or unspecified chronic kidney disease: Secondary | ICD-10-CM | POA: Diagnosis not present

## 2023-05-30 DIAGNOSIS — I8310 Varicose veins of unspecified lower extremity with inflammation: Secondary | ICD-10-CM | POA: Diagnosis not present

## 2023-05-30 DIAGNOSIS — K635 Polyp of colon: Secondary | ICD-10-CM | POA: Diagnosis not present

## 2023-05-30 DIAGNOSIS — D692 Other nonthrombocytopenic purpura: Secondary | ICD-10-CM | POA: Diagnosis not present

## 2023-05-30 DIAGNOSIS — Z23 Encounter for immunization: Secondary | ICD-10-CM | POA: Diagnosis not present

## 2023-05-31 DIAGNOSIS — M542 Cervicalgia: Secondary | ICD-10-CM | POA: Diagnosis not present

## 2023-06-04 DIAGNOSIS — M542 Cervicalgia: Secondary | ICD-10-CM | POA: Diagnosis not present

## 2023-06-06 DIAGNOSIS — M542 Cervicalgia: Secondary | ICD-10-CM | POA: Diagnosis not present

## 2023-06-11 DIAGNOSIS — M542 Cervicalgia: Secondary | ICD-10-CM | POA: Diagnosis not present

## 2023-06-13 DIAGNOSIS — M542 Cervicalgia: Secondary | ICD-10-CM | POA: Diagnosis not present

## 2023-06-13 DIAGNOSIS — Z6825 Body mass index (BMI) 25.0-25.9, adult: Secondary | ICD-10-CM | POA: Diagnosis not present

## 2023-06-13 DIAGNOSIS — G959 Disease of spinal cord, unspecified: Secondary | ICD-10-CM | POA: Diagnosis not present

## 2023-06-18 DIAGNOSIS — M542 Cervicalgia: Secondary | ICD-10-CM | POA: Diagnosis not present

## 2023-06-20 DIAGNOSIS — M542 Cervicalgia: Secondary | ICD-10-CM | POA: Diagnosis not present

## 2023-06-25 DIAGNOSIS — M542 Cervicalgia: Secondary | ICD-10-CM | POA: Diagnosis not present

## 2023-06-27 DIAGNOSIS — M542 Cervicalgia: Secondary | ICD-10-CM | POA: Diagnosis not present

## 2023-07-02 DIAGNOSIS — M542 Cervicalgia: Secondary | ICD-10-CM | POA: Diagnosis not present

## 2023-07-04 DIAGNOSIS — M542 Cervicalgia: Secondary | ICD-10-CM | POA: Diagnosis not present

## 2023-07-09 DIAGNOSIS — M542 Cervicalgia: Secondary | ICD-10-CM | POA: Diagnosis not present

## 2023-07-16 DIAGNOSIS — M542 Cervicalgia: Secondary | ICD-10-CM | POA: Diagnosis not present

## 2023-07-18 DIAGNOSIS — M542 Cervicalgia: Secondary | ICD-10-CM | POA: Diagnosis not present

## 2023-07-21 IMAGING — US US AORTA
1 series · 13 of 22 positions shown · non-contrast
Comparison: March 28, 2018.

CLINICAL DATA: Abdominal aortic aneurysm (AAA) without rupture
(HCC); 3 yr f/u from 3989

EXAM:
ULTRASOUND OF ABDOMINAL AORTA
TECHNIQUE: Ultrasound examination of the abdominal aorta and proximal common
iliac arteries was performed to evaluate for aneurysm. Additional
color and Doppler images of the distal aorta were obtained to
document patency.

[Series 1: us aorta · 0.26mm/px · 13 of 22 slices shown]
[im 1/22]
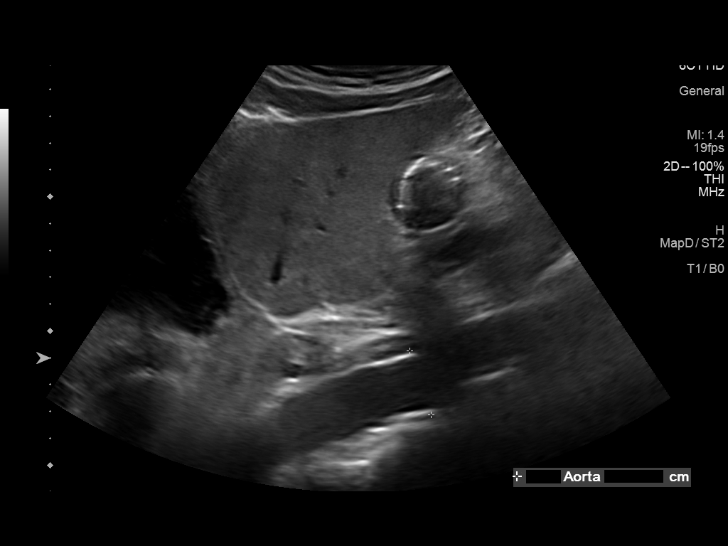
[im 3/22]
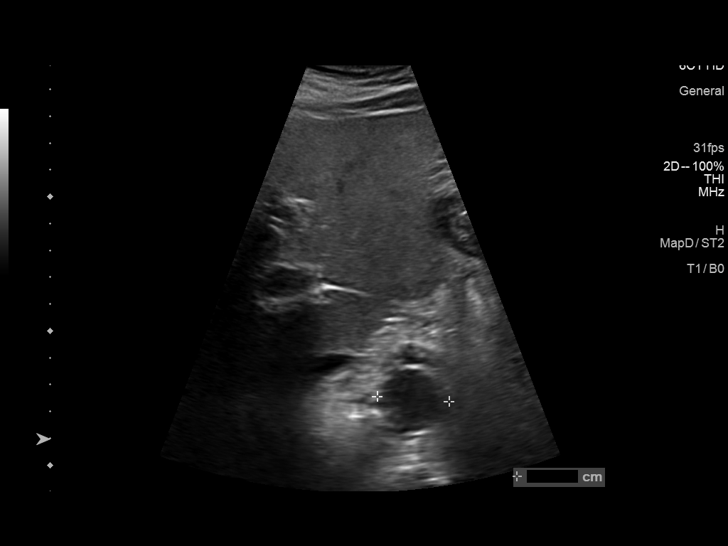
[im 5/22]
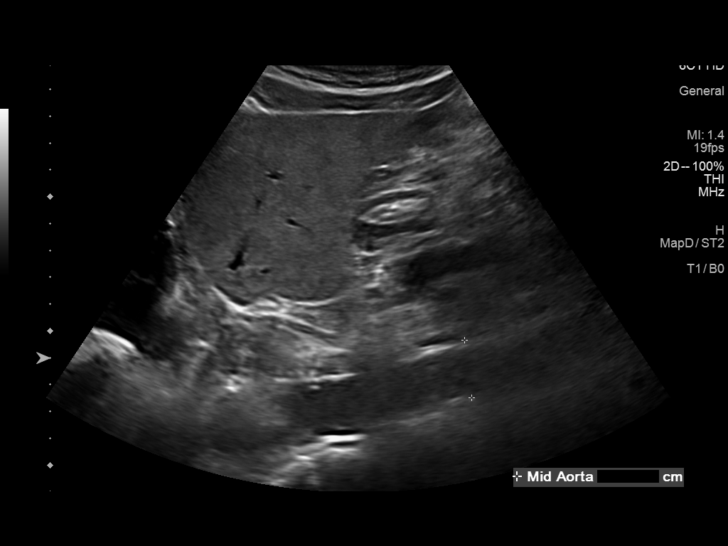
[im 6/22]
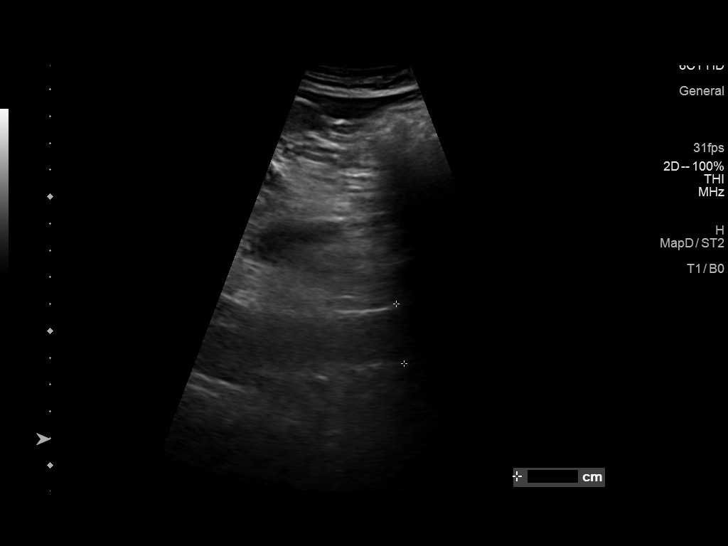
[im 8/22]
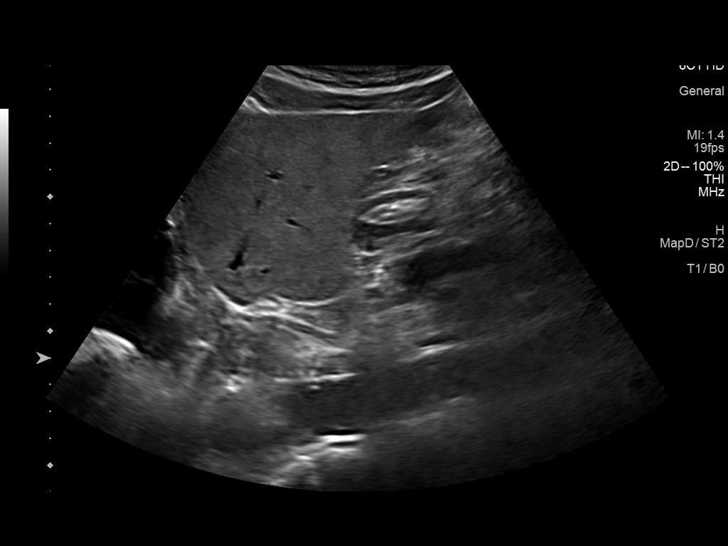
[im 10/22]
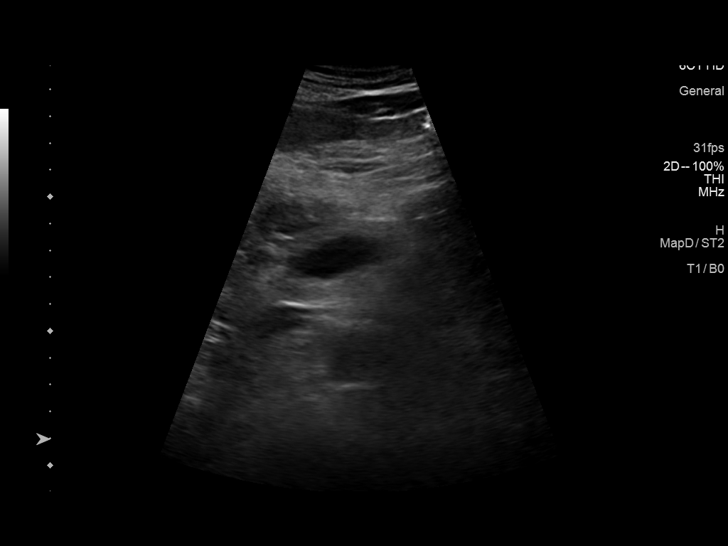
[im 12/22]
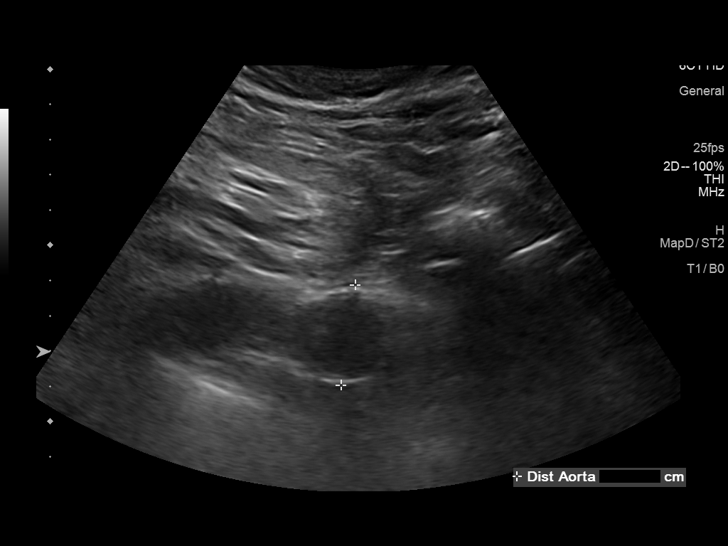
[im 13/22]
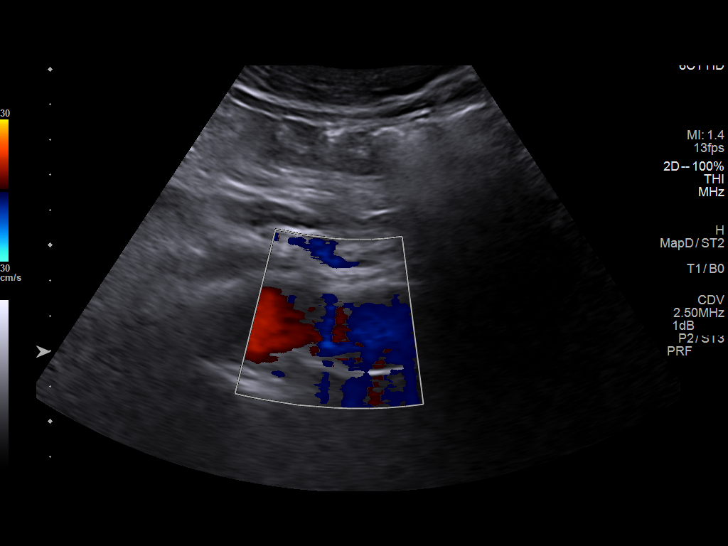
[im 15/22]
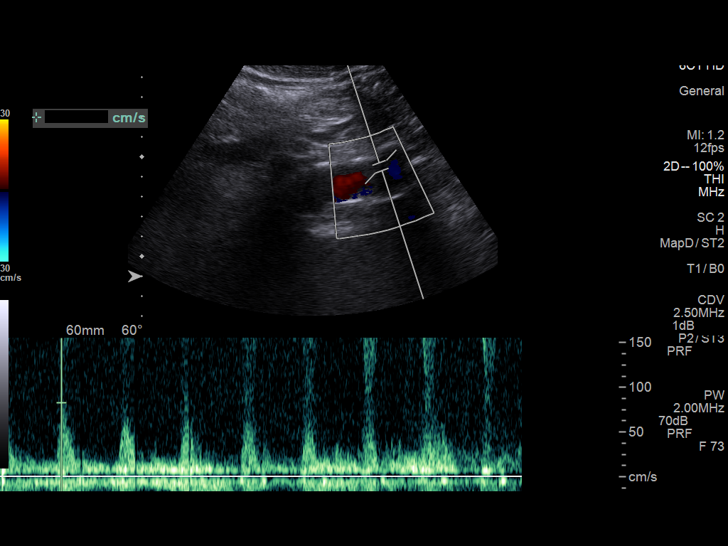
[im 17/22]
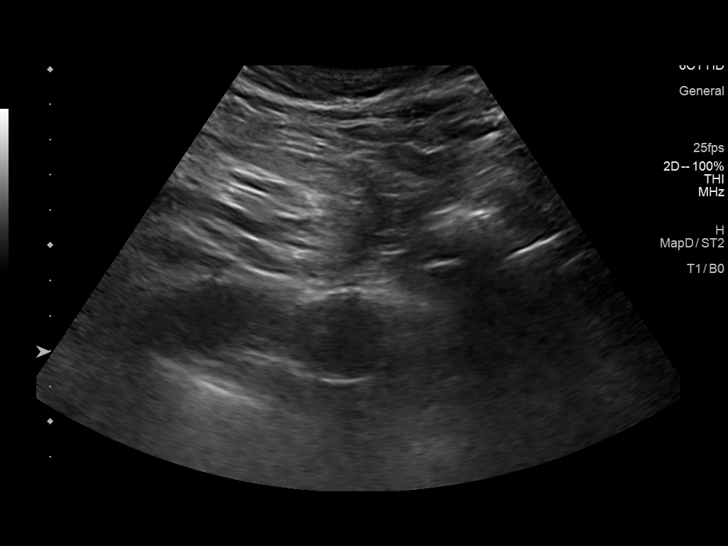
[im 18/22]
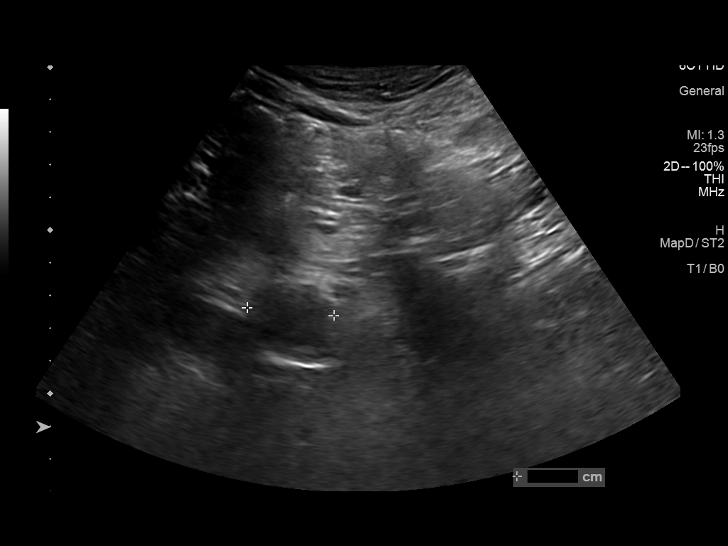
[im 20/22]
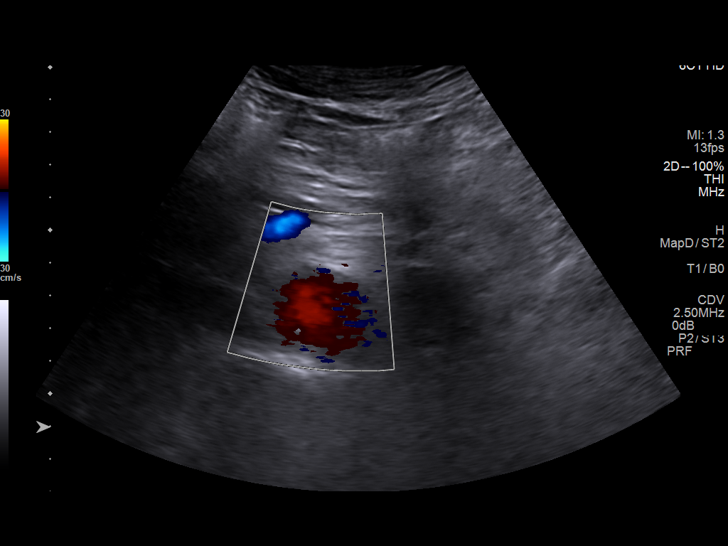
[im 22/22]
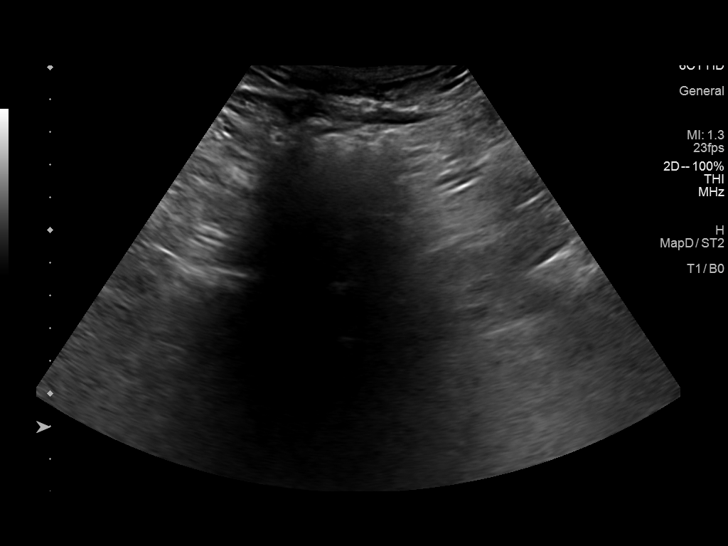

[13 of 22 positions shown; findings below may reference images not displayed]

FINDINGS: Limited evaluation secondary to body habitus and shadowing bowel
gas.

Abdominal aortic measurements as follows:

Proximal:  2.8 x 2.7 cm

Mid:  2.2 x 2.4 cm

Distal:  2.9 x 2.7 cm
Patent: Yes, peak systolic velocity is 83 cm/s

Right common iliac artery: Not visualized secondary to shadowing
bowel gas

Left common iliac artery: Not visualized secondary to shadowing
bowel gas
IMPRESSION: 1. Suboptimal assessment secondary to extensive shadowing bowel gas
and body habitus. Within these limitations, mild aneurysmal dilation
of the aorta which was previously measured up to 3 cm is similar in
comparison to prior. Recommend follow-up every 3 years. This
recommendation follows ACR consensus guidelines: White Paper of the
ACR Incidental Findings Committee II on Vascular Findings. [HOSPITAL] 0119; [DATE].

## 2023-07-23 DIAGNOSIS — M542 Cervicalgia: Secondary | ICD-10-CM | POA: Diagnosis not present

## 2023-07-25 DIAGNOSIS — M542 Cervicalgia: Secondary | ICD-10-CM | POA: Diagnosis not present

## 2023-07-30 DIAGNOSIS — M542 Cervicalgia: Secondary | ICD-10-CM | POA: Diagnosis not present

## 2023-08-01 DIAGNOSIS — M542 Cervicalgia: Secondary | ICD-10-CM | POA: Diagnosis not present

## 2023-08-06 DIAGNOSIS — M542 Cervicalgia: Secondary | ICD-10-CM | POA: Diagnosis not present

## 2023-08-08 DIAGNOSIS — M542 Cervicalgia: Secondary | ICD-10-CM | POA: Diagnosis not present

## 2023-08-15 DIAGNOSIS — Z6825 Body mass index (BMI) 25.0-25.9, adult: Secondary | ICD-10-CM | POA: Diagnosis not present

## 2023-08-15 DIAGNOSIS — G959 Disease of spinal cord, unspecified: Secondary | ICD-10-CM | POA: Diagnosis not present

## 2023-12-20 DIAGNOSIS — N182 Chronic kidney disease, stage 2 (mild): Secondary | ICD-10-CM | POA: Diagnosis not present

## 2023-12-20 DIAGNOSIS — G629 Polyneuropathy, unspecified: Secondary | ICD-10-CM | POA: Diagnosis not present

## 2023-12-20 DIAGNOSIS — I129 Hypertensive chronic kidney disease with stage 1 through stage 4 chronic kidney disease, or unspecified chronic kidney disease: Secondary | ICD-10-CM | POA: Diagnosis not present

## 2023-12-20 DIAGNOSIS — R7301 Impaired fasting glucose: Secondary | ICD-10-CM | POA: Diagnosis not present

## 2023-12-20 DIAGNOSIS — E782 Mixed hyperlipidemia: Secondary | ICD-10-CM | POA: Diagnosis not present

## 2023-12-20 DIAGNOSIS — R197 Diarrhea, unspecified: Secondary | ICD-10-CM | POA: Diagnosis not present

## 2023-12-24 DIAGNOSIS — R197 Diarrhea, unspecified: Secondary | ICD-10-CM | POA: Diagnosis not present

## 2023-12-30 DIAGNOSIS — R197 Diarrhea, unspecified: Secondary | ICD-10-CM | POA: Diagnosis not present

## 2024-02-11 DIAGNOSIS — N401 Enlarged prostate with lower urinary tract symptoms: Secondary | ICD-10-CM | POA: Diagnosis not present

## 2024-02-12 DIAGNOSIS — L539 Erythematous condition, unspecified: Secondary | ICD-10-CM | POA: Diagnosis not present

## 2024-02-12 DIAGNOSIS — I878 Other specified disorders of veins: Secondary | ICD-10-CM | POA: Diagnosis not present

## 2024-02-18 DIAGNOSIS — R972 Elevated prostate specific antigen [PSA]: Secondary | ICD-10-CM | POA: Diagnosis not present

## 2024-02-18 DIAGNOSIS — N401 Enlarged prostate with lower urinary tract symptoms: Secondary | ICD-10-CM | POA: Diagnosis not present

## 2024-02-18 DIAGNOSIS — R3912 Poor urinary stream: Secondary | ICD-10-CM | POA: Diagnosis not present

## 2024-03-10 DIAGNOSIS — G629 Polyneuropathy, unspecified: Secondary | ICD-10-CM | POA: Diagnosis not present

## 2024-03-10 DIAGNOSIS — G5753 Tarsal tunnel syndrome, bilateral lower limbs: Secondary | ICD-10-CM | POA: Diagnosis not present

## 2024-03-10 DIAGNOSIS — I890 Lymphedema, not elsewhere classified: Secondary | ICD-10-CM | POA: Diagnosis not present

## 2024-03-26 DIAGNOSIS — G5752 Tarsal tunnel syndrome, left lower limb: Secondary | ICD-10-CM | POA: Diagnosis not present

## 2024-03-26 DIAGNOSIS — G5751 Tarsal tunnel syndrome, right lower limb: Secondary | ICD-10-CM | POA: Diagnosis not present

## 2024-03-31 DIAGNOSIS — I890 Lymphedema, not elsewhere classified: Secondary | ICD-10-CM | POA: Diagnosis not present

## 2024-04-03 DIAGNOSIS — I890 Lymphedema, not elsewhere classified: Secondary | ICD-10-CM | POA: Diagnosis not present

## 2024-04-09 DIAGNOSIS — G5753 Tarsal tunnel syndrome, bilateral lower limbs: Secondary | ICD-10-CM | POA: Diagnosis not present

## 2024-04-09 DIAGNOSIS — M79672 Pain in left foot: Secondary | ICD-10-CM | POA: Diagnosis not present

## 2024-04-09 DIAGNOSIS — M79671 Pain in right foot: Secondary | ICD-10-CM | POA: Diagnosis not present

## 2024-04-09 DIAGNOSIS — I890 Lymphedema, not elsewhere classified: Secondary | ICD-10-CM | POA: Diagnosis not present

## 2024-04-09 DIAGNOSIS — G629 Polyneuropathy, unspecified: Secondary | ICD-10-CM | POA: Diagnosis not present

## 2024-04-28 DIAGNOSIS — I890 Lymphedema, not elsewhere classified: Secondary | ICD-10-CM | POA: Diagnosis not present

## 2024-05-04 DIAGNOSIS — I890 Lymphedema, not elsewhere classified: Secondary | ICD-10-CM | POA: Diagnosis not present

## 2024-05-11 DIAGNOSIS — I890 Lymphedema, not elsewhere classified: Secondary | ICD-10-CM | POA: Diagnosis not present

## 2024-05-19 DIAGNOSIS — I890 Lymphedema, not elsewhere classified: Secondary | ICD-10-CM | POA: Diagnosis not present

## 2024-05-27 DIAGNOSIS — I890 Lymphedema, not elsewhere classified: Secondary | ICD-10-CM | POA: Diagnosis not present

## 2024-06-03 DIAGNOSIS — Z1331 Encounter for screening for depression: Secondary | ICD-10-CM | POA: Diagnosis not present

## 2024-06-03 DIAGNOSIS — K635 Polyp of colon: Secondary | ICD-10-CM | POA: Diagnosis not present

## 2024-06-03 DIAGNOSIS — I8310 Varicose veins of unspecified lower extremity with inflammation: Secondary | ICD-10-CM | POA: Diagnosis not present

## 2024-06-03 DIAGNOSIS — Z0000 Encounter for general adult medical examination without abnormal findings: Secondary | ICD-10-CM | POA: Diagnosis not present

## 2024-06-03 DIAGNOSIS — R7301 Impaired fasting glucose: Secondary | ICD-10-CM | POA: Diagnosis not present

## 2024-06-03 DIAGNOSIS — I890 Lymphedema, not elsewhere classified: Secondary | ICD-10-CM | POA: Diagnosis not present

## 2024-06-03 DIAGNOSIS — J440 Chronic obstructive pulmonary disease with acute lower respiratory infection: Secondary | ICD-10-CM | POA: Diagnosis not present

## 2024-06-03 DIAGNOSIS — N182 Chronic kidney disease, stage 2 (mild): Secondary | ICD-10-CM | POA: Diagnosis not present

## 2024-06-03 DIAGNOSIS — N400 Enlarged prostate without lower urinary tract symptoms: Secondary | ICD-10-CM | POA: Diagnosis not present

## 2024-06-03 DIAGNOSIS — I129 Hypertensive chronic kidney disease with stage 1 through stage 4 chronic kidney disease, or unspecified chronic kidney disease: Secondary | ICD-10-CM | POA: Diagnosis not present

## 2024-06-03 DIAGNOSIS — E782 Mixed hyperlipidemia: Secondary | ICD-10-CM | POA: Diagnosis not present

## 2024-06-03 DIAGNOSIS — I714 Abdominal aortic aneurysm, without rupture, unspecified: Secondary | ICD-10-CM | POA: Diagnosis not present

## 2024-06-08 DIAGNOSIS — L720 Epidermal cyst: Secondary | ICD-10-CM | POA: Diagnosis not present

## 2024-06-09 DIAGNOSIS — G629 Polyneuropathy, unspecified: Secondary | ICD-10-CM | POA: Diagnosis not present

## 2024-06-09 DIAGNOSIS — M79672 Pain in left foot: Secondary | ICD-10-CM | POA: Diagnosis not present

## 2024-06-09 DIAGNOSIS — M79671 Pain in right foot: Secondary | ICD-10-CM | POA: Diagnosis not present

## 2024-06-10 DIAGNOSIS — I890 Lymphedema, not elsewhere classified: Secondary | ICD-10-CM | POA: Diagnosis not present

## 2024-06-15 DIAGNOSIS — Z1211 Encounter for screening for malignant neoplasm of colon: Secondary | ICD-10-CM | POA: Diagnosis not present

## 2024-06-17 DIAGNOSIS — I890 Lymphedema, not elsewhere classified: Secondary | ICD-10-CM | POA: Diagnosis not present

## 2024-06-18 DIAGNOSIS — L728 Other follicular cysts of the skin and subcutaneous tissue: Secondary | ICD-10-CM | POA: Diagnosis not present

## 2024-07-07 DIAGNOSIS — L720 Epidermal cyst: Secondary | ICD-10-CM | POA: Diagnosis not present

## 2024-07-07 DIAGNOSIS — L728 Other follicular cysts of the skin and subcutaneous tissue: Secondary | ICD-10-CM | POA: Diagnosis not present

## 2024-07-07 DIAGNOSIS — R195 Other fecal abnormalities: Secondary | ICD-10-CM | POA: Diagnosis not present

## 2024-07-07 DIAGNOSIS — K5900 Constipation, unspecified: Secondary | ICD-10-CM | POA: Diagnosis not present

## 2024-07-07 DIAGNOSIS — Z860101 Personal history of adenomatous and serrated colon polyps: Secondary | ICD-10-CM | POA: Diagnosis not present

## 2024-07-16 DIAGNOSIS — I723 Aneurysm of iliac artery: Secondary | ICD-10-CM | POA: Diagnosis not present

## 2024-07-16 DIAGNOSIS — I714 Abdominal aortic aneurysm, without rupture, unspecified: Secondary | ICD-10-CM | POA: Diagnosis not present
